# Patient Record
Sex: Female | Born: 1980 | Race: White | Hispanic: No | Marital: Married | Smoking: Former smoker
Health system: Southern US, Community
[De-identification: ages and names within clinical notes are randomized; demographics above are authoritative.]

## PROBLEM LIST (undated history)

## (undated) DIAGNOSIS — Z1371 Encounter for nonprocreative screening for genetic disease carrier status: Secondary | ICD-10-CM

## (undated) DIAGNOSIS — R87619 Unspecified abnormal cytological findings in specimens from cervix uteri: Secondary | ICD-10-CM

## (undated) HISTORY — DX: Encounter for nonprocreative screening for genetic disease carrier status: Z13.71

## (undated) HISTORY — DX: Unspecified abnormal cytological findings in specimens from cervix uteri: R87.619

---

## 2003-04-16 HISTORY — PX: COLPOSCOPY: SHX161

## 2015-09-08 ENCOUNTER — Ambulatory Visit (INDEPENDENT_AMBULATORY_CARE_PROVIDER_SITE_OTHER): Payer: Managed Care, Other (non HMO) | Admitting: Nurse Practitioner

## 2015-09-08 ENCOUNTER — Encounter: Payer: Self-pay | Admitting: Nurse Practitioner

## 2015-09-08 ENCOUNTER — Telehealth: Payer: Self-pay | Admitting: Nurse Practitioner

## 2015-09-08 VITALS — BP 112/66 | HR 60 | Ht 67.0 in | Wt 149.0 lb

## 2015-09-08 DIAGNOSIS — Z0000 Encounter for general adult medical examination without abnormal findings: Secondary | ICD-10-CM | POA: Diagnosis not present

## 2015-09-08 DIAGNOSIS — Z3009 Encounter for other general counseling and advice on contraception: Secondary | ICD-10-CM

## 2015-09-08 DIAGNOSIS — Z803 Family history of malignant neoplasm of breast: Secondary | ICD-10-CM

## 2015-09-08 DIAGNOSIS — Z23 Encounter for immunization: Secondary | ICD-10-CM

## 2015-09-08 DIAGNOSIS — Z1211 Encounter for screening for malignant neoplasm of colon: Secondary | ICD-10-CM

## 2015-09-08 DIAGNOSIS — Z01419 Encounter for gynecological examination (general) (routine) without abnormal findings: Secondary | ICD-10-CM

## 2015-09-08 LAB — POCT URINALYSIS DIPSTICK
BILIRUBIN UA: NEGATIVE
Blood, UA: NEGATIVE
GLUCOSE UA: NEGATIVE
KETONES UA: NEGATIVE
LEUKOCYTES UA: NEGATIVE
NITRITE UA: NEGATIVE
Protein, UA: NEGATIVE
Urobilinogen, UA: NEGATIVE
pH, UA: 5.5

## 2015-09-08 LAB — COMPREHENSIVE METABOLIC PANEL
ALT: 16 U/L (ref 6–29)
AST: 19 U/L (ref 10–30)
Albumin: 4.5 g/dL (ref 3.6–5.1)
Alkaline Phosphatase: 29 U/L — ABNORMAL LOW (ref 33–115)
BUN: 10 mg/dL (ref 7–25)
CALCIUM: 9.4 mg/dL (ref 8.6–10.2)
CO2: 25 mmol/L (ref 20–31)
Chloride: 102 mmol/L (ref 98–110)
Creat: 0.83 mg/dL (ref 0.50–1.10)
GLUCOSE: 84 mg/dL (ref 65–99)
POTASSIUM: 4.4 mmol/L (ref 3.5–5.3)
Sodium: 139 mmol/L (ref 135–146)
Total Bilirubin: 0.5 mg/dL (ref 0.2–1.2)
Total Protein: 7.3 g/dL (ref 6.1–8.1)

## 2015-09-08 LAB — TSH: TSH: 1.73 m[IU]/L

## 2015-09-08 LAB — LIPID PANEL
CHOL/HDL RATIO: 2.2 ratio (ref <=5.0)
Cholesterol: 146 mg/dL (ref 125–200)
HDL: 66 mg/dL (ref >=46)
LDL CALC: 64 mg/dL (ref <130)
Triglycerides: 80 mg/dL (ref <150)
VLDL: 16 mg/dL (ref <30)

## 2015-09-08 NOTE — Telephone Encounter (Signed)
Return call to patient, left message to call back. Ask for triage nurse. 

## 2015-09-08 NOTE — Telephone Encounter (Signed)
Patient returned call. Had appointment today with Shirlyn GoltzPatty Grubb, FNP and discussed Mirena IUD. Has had this in past.  Currently on OCP and desires to change to Mirena. Menses due week of 09-18-15. G2 P2.  Appointment scheduled for 09-20-15 with Dr Oscar LaJertson as patient discussed with Patty.  Advised to take Motrin 800 mg one hour prior with food.   Routing to provider for final review. Patient agreeable to disposition. Will close encounter.    CC: Dr Oscar LaJertson

## 2015-09-08 NOTE — Patient Instructions (Signed)

## 2015-09-08 NOTE — Telephone Encounter (Signed)
Patient has checked insurance benefits for iud procedure and is calling to schedule.

## 2015-09-08 NOTE — Progress Notes (Signed)
Patient ID: Holly Vasquez, female   DOB: 05/10/1980, 35 y.o.   MRN: [id number]  35 y.o. G2P2002 Married  Caucasian Fe here for NGYN annual exam.  Menses is regular.  Flow now at 3 days.  Moderate to light.  Previous Mirena IUD with amenorrhea.  Mirena was changed to OCP about 2013.  She desires to go back on Mirena IUD with next menses if at all possible.   She has a strong family history of colon cancer on her father's side of the family with her father passing at age 33.  She desires further genetic testing and screening colonoscopy.  There is also breast cancer with MGM. They have moved here with her husbands job as a Investment banker, operational[employer]f [place]  She has 2 sons who are active.  Patient's last menstrual period was 08/22/2015 (exact date).          Sexually active: Yes.    The current method of family planning is OCP (estrogen/progesterone).    Exercising: Yes.    running 5 days per week, weight lifting 2 days per week Smoker:  no  Health Maintenance: Pap:  2013, normal per patient, history of abnormal in 2005 TDaP:  2008? HIV: 2005 with pregnancy Labs: discuss today   Urine:negative   reports that she quit smoking about 12 years ago. She has never used smokeless tobacco. She reports that she drinks about 1.8 - 2.4 oz of alcohol per week. She reports that she does not use illicit drugs.  Past Medical History  Diagnosis Date  . Abnormal Pap smear of cervix 2005 & 2008    colpo biopsy    Past Surgical History  Procedure Laterality Date  . Colposcopy  2005    Current Outpatient Prescriptions  Medication Sig Dispense Refill  . levonorgestrel-ethinyl estradiol (AVIANE,ALESSE,LESSINA) 0.1-20 MG-MCG tablet Take 1 tablet by mouth daily.     No current facility-administered medications for this visit.    Family History  Problem Relation Age of Onset  . Colon cancer Father 7633    colon with mets  . Brain cancer Paternal Uncle 55    brain cancer  . Stroke Maternal Grandmother 40  .  Breast cancer Maternal Grandmother 38    lumpectomy and radiation  . Diabetes Maternal Grandfather   . Diabetes Paternal Grandmother   . Kidney Stones Paternal Grandmother   . Alzheimer's disease Paternal Grandmother   . Heart disease Paternal Grandfather   . Heart attack Paternal Grandfather     multiple  . Prostate cancer Paternal Uncle [phone]    prostate  . Colon cancer Paternal Uncle [id number]    colon    ROS:  Pertinent items are noted in HPI.  Otherwise, a comprehensive ROS was negative.  Exam:   BP 112/66 mmHg  Pulse 60  Ht 5\' 7"  (1.702 m)  Wt 149 lb (67.586 kg)  BMI 23.33 kg/m2  LMP 08/22/2015 (Exact Date) Height: 5\' 7"  (170.2 cm) Ht Readings from Last 3 Encounters:  09/08/15 5\' 7"  (1.702 m)    General appearance: alert, cooperative and appears stated age Head: Normocephalic, without obvious abnormality, atraumatic Neck: no adenopathy, supple, symmetrical, trachea midline and thyroid normal to inspection and palpation Lungs: clear to auscultation bilaterally Breasts: normal appearance, no masses or tenderness Heart: regular rate and rhythm Abdomen: soft, non-tender; no masses,  no organomegaly Extremities: extremities normal, atraumatic, no cyanosis or edema Skin: Skin color, texture, turgor normal. No rashes or lesions Lymph nodes: Cervical, supraclavicular, and axillary nodes normal.  No abnormal inguinal nodes palpated Neurologic: Grossly normal   Pelvic: External genitalia:  no lesions              Urethra:  normal appearing urethra with no masses, tenderness or lesions              Bartholin's and Skene's: normal                 Vagina: normal appearing vagina with normal color and discharge, no lesions              Cervix: anteverted              Pap taken: Yes.   Bimanual Exam:  Uterus:  normal size, contour, position, consistency, mobility, non-tender              Adnexa: no mass, fullness, tenderness               Rectovaginal: Confirms               Anus:   normal sphincter tone, no lesions  Chaperone present: yes  A:  Well Woman with normal exam  Currently OCP and desires to go back on Mirena IUD  Strong [facility] of colon cancer, father diagnosed at age 64 and passed age 24  Desires genetic testing  Update immunization  P:   Reviewed health and wellness pertinent to exam  Pap smear as above  Mammogram will get baseline screening  Referral to genetic counseling  Given information about Mirena with insurance codes and she will verify coverage.  Will send a note to Dr. Oscar La and hopefully get this done week of June 5th  Update of TDaP is given today  Will follow with labs  Referral to Dr. Loreta Ave counseled on breast self exam, mammography screening, family planning choices, adequate intake of calcium and vitamin D, diet and exercise return annually or prn  An After Visit Summary was printed and given to the patient.

## 2015-09-09 LAB — VITAMIN D 25 HYDROXY (VIT D DEFICIENCY, FRACTURES): VIT D 25 HYDROXY: 54 ng/mL (ref 30–100)

## 2015-09-11 NOTE — Progress Notes (Signed)
Encounter reviewed by Dr. Natayla Cadenhead Amundson C. Silva.  

## 2015-09-12 ENCOUNTER — Other Ambulatory Visit: Payer: Self-pay | Admitting: Nurse Practitioner

## 2015-09-12 DIAGNOSIS — Z1231 Encounter for screening mammogram for malignant neoplasm of breast: Secondary | ICD-10-CM

## 2015-09-12 NOTE — Addendum Note (Signed)
Addended by: Ria CommentGRUBB, Roxy Filler R on: 09/12/2015 11:12 AM   Modules accepted: Orders

## 2015-09-14 HISTORY — PX: INTRAUTERINE DEVICE (IUD) INSERTION: SHX5877

## 2015-09-14 LAB — IPS PAP TEST WITH HPV

## 2015-09-18 ENCOUNTER — Telehealth: Payer: Self-pay | Admitting: Genetic Counselor

## 2015-09-18 ENCOUNTER — Encounter: Payer: Self-pay | Admitting: Genetic Counselor

## 2015-09-18 NOTE — Telephone Encounter (Signed)
Verified address and insurance, in basket referring date/time, and mailed new pt packet

## 2015-09-19 ENCOUNTER — Telehealth: Payer: Self-pay | Admitting: Obstetrics and Gynecology

## 2015-09-19 NOTE — Telephone Encounter (Signed)
Patient has iud insertion appointment tomorrow and has not started her cycle. Patient is asking if she will need to reschedule if she does not start her cyce by tomorrow morning?

## 2015-09-19 NOTE — Telephone Encounter (Signed)
Spoke with patient. Patient states that she started her placebo pills of her Alesse OCP pack on Sunday 09/17/2015. On 09/17/2015 she began to have light bleeding which stopped yesterday. Asking if she may keep her appointment as scheduled for tomorrow for IUD insertion. Advised it is okay to keep her appointment as scheduled for tomorrow 09/20/2015 at 8:30 am for IUD insertion. She is agreeable and verbalizes understanding.  Routing to provider for final review. Patient agreeable to disposition. Will close encounter.

## 2015-09-20 ENCOUNTER — Ambulatory Visit (INDEPENDENT_AMBULATORY_CARE_PROVIDER_SITE_OTHER): Payer: Managed Care, Other (non HMO) | Admitting: Obstetrics and Gynecology

## 2015-09-20 ENCOUNTER — Encounter: Payer: Self-pay | Admitting: Obstetrics and Gynecology

## 2015-09-20 VITALS — BP 108/70 | HR 52 | Resp 12 | Wt 147.0 lb

## 2015-09-20 DIAGNOSIS — Z01812 Encounter for preprocedural laboratory examination: Secondary | ICD-10-CM | POA: Diagnosis not present

## 2015-09-20 DIAGNOSIS — Z3009 Encounter for other general counseling and advice on contraception: Secondary | ICD-10-CM | POA: Diagnosis not present

## 2015-09-20 DIAGNOSIS — Z30430 Encounter for insertion of intrauterine contraceptive device: Secondary | ICD-10-CM

## 2015-09-20 DIAGNOSIS — Z113 Encounter for screening for infections with a predominantly sexual mode of transmission: Secondary | ICD-10-CM

## 2015-09-20 LAB — POCT URINE PREGNANCY: PREG TEST UR: NEGATIVE

## 2015-09-20 NOTE — Patient Instructions (Signed)
IUD Post-procedure Instructions . Cramping is common.  You may take Ibuprofen, Aleve, or Tylenol for the cramping.  This should resolve within 24 hours.   . You may have a small amount of spotting.  You should wear a mini pad for the next few days. . You may have intercourse in 24 hours. . You need to call the office if you have any pelvic pain, fever, heavy bleeding, or foul smelling vaginal discharge. . Shower or bathe as normal Use condoms for 1 week

## 2015-09-20 NOTE — Addendum Note (Signed)
Addended by: Michell Heinrich'NEAL, Kodie Kishi D on: 09/20/2015 09:50 AM   Modules accepted: Orders

## 2015-09-20 NOTE — Progress Notes (Signed)
GYNECOLOGY  VISIT   HPI: 35 y.o.   Married  Caucasian  female   G2P2002 with Patient's last menstrual period was 09/19/2015 (approximate).   here for IUD Insertion. She has had a mirena IUD in the past and did well with it.   GYNECOLOGIC HISTORY: Patient's last menstrual period was 09/19/2015 (approximate). Contraception:IUD Menopausal hormone therapy: none        OB History    Gravida Para Term Preterm AB TAB SAB Ectopic Multiple Living   2 2 2  0 0 0 0 0 0 2         There are no active problems to display for this patient.   Past Medical History  Diagnosis Date  . Abnormal Pap smear of cervix 2005 & 2008    colpo biopsy    Past Surgical History  Procedure Laterality Date  . Colposcopy  2005    Current Outpatient Prescriptions  Medication Sig Dispense Refill  . levonorgestrel (MIRENA) 20 MCG/24HR IUD 1 each by Intrauterine route once.     No current facility-administered medications for this visit.     ALLERGIES: Review of patient's allergies indicates no known allergies.  Family History  Problem Relation Age of Onset  . Colon cancer Father 3233    colon with mets  . Brain cancer Paternal Uncle 55    brain cancer  . Stroke Maternal Grandmother 40  . Breast cancer Maternal Grandmother 38    lumpectomy and radiation  . Diabetes Maternal Grandfather   . Diabetes Paternal Grandmother   . Kidney Stones Paternal Grandmother   . Alzheimer's disease Paternal Grandmother   . Heart disease Paternal Grandfather   . Heart attack Paternal Grandfather     multiple  . Prostate cancer Paternal Uncle [phone]    prostate  . Colon cancer Paternal Uncle 1056    colon    Social History   Social History  . Marital Status: Married    Spouse Name: N/A  . Number of Children: N/A  . Years of Education: N/A   Occupational History  . Not on file.   Social History Main Topics  . Smoking status: Former Smoker    Quit date: 04/16/2003  . Smokeless tobacco: Never Used  . Alcohol  Use: 1.8 - 2.4 oz/week    3-4 Standard drinks or equivalent per week  . Drug Use: No  . Sexual Activity:    Partners: Male    Birth Control/ Protection: Pill   Other Topics Concern  . Not on file   Social History Narrative    ROS  PHYSICAL EXAMINATION:    BP 108/70 mmHg  Pulse 52  Resp 12  Wt 147 lb (66.679 kg)  LMP 09/19/2015 (Approximate)    General appearance: alert, cooperative and appears stated age  Pelvic: External genitalia:  no lesions              Urethra:  normal appearing urethra with no masses, tenderness or lesions              Bartholins and Skenes: normal                 Vagina: normal appearing vagina with normal color and discharge, no lesions              Cervix: no lesions              Bimanual Exam:  Uterus:  normal size, contour, position, consistency, mobility, non-tender  Adnexa: no mass, fullness, tenderness                The risks of the mirena IUD were reviewed with the patient, including infection, abnormal bleeding and uterine perfortion. Consent was signed.  A speculum was placed in the vagina, the cervix was cleansed with betadine. A tenaculum was placed on the cervix, the uterus sounded to 7 cm. The mirena IUD was inserted without difficulty. The string were cut to 3-4 cm. The tenaculum was removed. Slight oozing from the tenaculum site was stopped with pressure.   The patient tolerated the procedure well.   Chaperone was present for exam.  ASSESSMENT Mirena IUD insertion    PLAN Screening for GC/CT F/U in 1 month, call with any concerns   An After Visit Summary was printed and given to the patient.

## 2015-09-21 LAB — GC/CHLAMYDIA PROBE AMP
CT Probe RNA: NOT DETECTED
GC PROBE AMP APTIMA: NOT DETECTED

## 2015-09-22 ENCOUNTER — Ambulatory Visit: Payer: Self-pay

## 2015-10-18 ENCOUNTER — Encounter: Payer: Self-pay | Admitting: Obstetrics and Gynecology

## 2015-10-18 ENCOUNTER — Ambulatory Visit (INDEPENDENT_AMBULATORY_CARE_PROVIDER_SITE_OTHER): Payer: Managed Care, Other (non HMO) | Admitting: Obstetrics and Gynecology

## 2015-10-18 VITALS — BP 100/60 | HR 64 | Resp 14 | Wt 144.0 lb

## 2015-10-18 DIAGNOSIS — Z30431 Encounter for routine checking of intrauterine contraceptive device: Secondary | ICD-10-CM

## 2015-10-18 NOTE — Progress Notes (Signed)
Patient ID: Holly Vasquez, female   DOB: 10/23/1980, 35 y.o.   MRN: [id number] GYNECOLOGY  VISIT   HPI: 35 y.o.   Married  Caucasian  female   G2P2002 with Patient's last menstrual period was 10/17/2015.   here for  Follow up IUD. She had a mirena IUD placed last month. Last week she had some increased cramping, now she feels fine. Currently without c/o. She just started spotting yesterday.   GYNECOLOGIC HISTORY: Patient's last menstrual period was 10/17/2015. Contraception:IUD Menopausal hormone therapy: none         OB History    Gravida Para Term Preterm AB TAB SAB Ectopic Multiple Living   2 2 2  0 0 0 0 0 0 2         There are no active problems to display for this patient.   Past Medical History  Diagnosis Date  . Abnormal Pap smear of cervix 2005 & 2008    colpo biopsy    Past Surgical History  Procedure Laterality Date  . Colposcopy  2005  . Intrauterine device (iud) insertion  09/2015    Current Outpatient Prescriptions  Medication Sig Dispense Refill  . levonorgestrel (MIRENA) 20 MCG/24HR IUD 1 each by Intrauterine route once.     No current facility-administered medications for this visit.     ALLERGIES: Review of patient's allergies indicates no known allergies.  Family History  Problem Relation Age of Onset  . Colon cancer Father 3633    colon with mets  . Brain cancer Paternal Uncle 55    brain cancer  . Stroke Maternal Grandmother 40  . Breast cancer Maternal Grandmother 38    lumpectomy and radiation  . Diabetes Maternal Grandfather   . Diabetes Paternal Grandmother   . Kidney Stones Paternal Grandmother   . Alzheimer's disease Paternal Grandmother   . Heart disease Paternal Grandfather   . Heart attack Paternal Grandfather     multiple  . Prostate cancer Paternal Uncle 7355    prostate  . Colon cancer Paternal Uncle 6956    colon    Social History   Social History  . Marital Status: Married    Spouse Name: N/A  . Number of Children: N/A  .  Years of Education: N/A   Occupational History  . Not on file.   Social History Main Topics  . Smoking status: Former Smoker    Quit date: 04/16/2003  . Smokeless tobacco: Never Used  . Alcohol Use: 1.8 - 2.4 oz/week    3-4 Standard drinks or equivalent per week  . Drug Use: No  . Sexual Activity:    Partners: Male    Birth Control/ Protection: Pill   Other Topics Concern  . Not on file   Social History Narrative   6/17: Husband is the head chef of [employer]. 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 Boys are 8 and 12. [place]. Adjusting well.     Review of Systems  Constitutional: Negative.   HENT: Negative.   Eyes: Negative.   Respiratory: Negative.   Cardiovascular: Negative.   Genitourinary: Negative.   Musculoskeletal: Negative.   Skin: Negative.   Neurological: Negative.   Endo/Heme/Allergies: Negative.   Psychiatric/Behavioral: Negative.     PHYSICAL EXAMINATION:    BP 100/60 mmHg  Pulse 64  Resp 14  Wt 144 lb (65.318 kg)  LMP 10/17/2015    General appearance: alert, cooperative and appears stated age Abdomen: soft,  non-tender; bowel sounds normal; no masses,  no organomegaly  Pelvic: External genitalia:  no lesions              Urethra:  normal appearing urethra with no masses, tenderness or lesions              Bartholins and Skenes: normal                 Vagina: normal appearing vagina with normal color and discharge, no lesions              Cervix: no lesions and IUD string 2 cm              Bimanual Exam:  Uterus:  normal size, contour, position, consistency, mobility, non-tender and anteverted              Adnexa: no mass, fullness, tenderness               Chaperone was present for exam.  ASSESSMENT IUD check, she had some increased cramping last week, now feels fine.    PLAN If she has recurrent cramping, she will f/u for an ultrasound F/U with Mrs. Berneice GandyGrubb for an annual exam next May   An  After Visit Summary was printed and given to the patient.

## 2015-11-01 ENCOUNTER — Other Ambulatory Visit: Payer: Self-pay

## 2015-11-01 ENCOUNTER — Encounter: Payer: Self-pay | Admitting: Genetic Counselor

## 2015-11-27 ENCOUNTER — Encounter: Payer: Self-pay | Admitting: Genetic Counselor

## 2015-11-28 ENCOUNTER — Other Ambulatory Visit: Payer: Managed Care, Other (non HMO)

## 2015-11-28 ENCOUNTER — Ambulatory Visit (HOSPITAL_BASED_OUTPATIENT_CLINIC_OR_DEPARTMENT_OTHER): Payer: Managed Care, Other (non HMO) | Admitting: Genetic Counselor

## 2015-11-28 DIAGNOSIS — Z808 Family history of malignant neoplasm of other organs or systems: Secondary | ICD-10-CM | POA: Diagnosis not present

## 2015-11-28 DIAGNOSIS — Z803 Family history of malignant neoplasm of breast: Secondary | ICD-10-CM | POA: Diagnosis not present

## 2015-11-28 DIAGNOSIS — Z809 Family history of malignant neoplasm, unspecified: Secondary | ICD-10-CM | POA: Diagnosis not present

## 2015-11-28 DIAGNOSIS — Z801 Family history of malignant neoplasm of trachea, bronchus and lung: Secondary | ICD-10-CM

## 2015-11-28 DIAGNOSIS — Z800 Family history of malignant neoplasm of digestive organs: Secondary | ICD-10-CM | POA: Diagnosis not present

## 2015-11-28 DIAGNOSIS — Z8042 Family history of malignant neoplasm of prostate: Secondary | ICD-10-CM

## 2015-11-29 ENCOUNTER — Encounter: Payer: Self-pay | Admitting: Genetic Counselor

## 2015-11-29 DIAGNOSIS — Z803 Family history of malignant neoplasm of breast: Secondary | ICD-10-CM | POA: Insufficient documentation

## 2015-11-29 DIAGNOSIS — Z800 Family history of malignant neoplasm of digestive organs: Secondary | ICD-10-CM | POA: Insufficient documentation

## 2015-11-29 NOTE — Progress Notes (Signed)
REFERRING PROVIDER: Kem Boroughs, FNP  PRIMARY PROVIDER:  No PCP Per Patient  PRIMARY REASON FOR VISIT:  1. Family history of colon cancer   2. Family history of breast cancer in female   3. Family history of prostate cancer   4. Family history of brain cancer   5. Family history of lung cancer   6. Family history of cancer      HISTORY OF PRESENT ILLNESS:   Holly Vasquez, a 35 y.o. female, was seen for a [place] cancer genetics consultation at the request of Edman Circle, FNP due to a family history of early-onset colon and breast cancers.  Holly Vasquez presents to clinic today to discuss the possibility of a hereditary predisposition to cancer, genetic testing, and to further clarify her future cancer risks, as well as potential cancer risks for family members.   Holly Vasquez is a 35 y.o. female with no personal history of cancer.     HORMONAL RISK FACTORS:  Menarche was at age 60.  First live birth at age 84.  OCP use -  pills for 5 years; Mirena IUD for 5 years; Nuvaring for 1 year.  Ovaries intact: yes.  Hysterectomy: no.  Menopausal status: premenopausal.  HRT use: 0 years. Colonoscopy: Schedule for first colonoscopy next week (w/ Dr. Collene Mares) Mammogram within the last year: no - scheduled for first mammogram in September 2017 (next month). Number of breast biopsies: 0. Up to date with pelvic exams:  yes. Any excessive radiation exposure/other exposures in the past:  no  Past Medical History:  Diagnosis Date  . Abnormal Pap smear of cervix 2005 & 2008   colpo biopsy    Past Surgical History:  Procedure Laterality Date  . COLPOSCOPY  2005  . INTRAUTERINE DEVICE (IUD) INSERTION  09/2015    Social History   Social History  . Marital status: Married    Spouse name: N/A  . Number of children: N/A  . Years of education: N/A   Social History Main Topics  . Smoking status: Former Smoker    Packs/day: 1.00    Years: 3.00    Quit date: 04/16/2003  . Smokeless  tobacco: Never Used     Comment: 1 ppd for 3-4 yrs  . Alcohol use 1.8 - 2.4 oz/week    3 - 4 Standard drinks or equivalent per week  . Drug use: No  . Sexual activity: Yes    Partners: Male    Birth control/ protection: Pill   Other Topics Concern  . None   Social History Narrative   6/17: Husband is the head chef of [employer]. She is working in a Charity fundraiser. Boys are 8 and 12. They just moved here from [facility] in 3/16. Adjusting well.      FAMILY HISTORY:  We obtained a detailed, 4-generation family history.  Significant diagnoses are listed below: Family History  Problem Relation Age of Onset  . Colon polyps Mother     approx 2 polyps  . Colon cancer Father 34    colon with mets to lungs; colon surgery w/ recurrence; +smoker and heavy alcohol use  . Stroke Maternal Grandmother 40  . Breast cancer Maternal Grandmother 40    lumpectomy and radiation  . Diabetes Maternal Grandfather   . Diabetes Paternal Grandmother   . Kidney Stones Paternal Grandmother   . Alzheimer's disease Paternal Grandmother   . Heart disease Paternal Grandfather   . Heart attack Paternal Grandfather  multiple  . Brain cancer Paternal Uncle 64  . Lung cancer Paternal Uncle 9  . Prostate cancer Paternal Uncle 32    prostate  . Colon cancer Paternal Uncle     dx. mid-50s; something else removed from colon at age 53y  . Colon cancer Paternal Uncle 61    colon; "Lynch syndrome"  . Cancer Other     maternal great grandfather (MGM's father); dx. older age; NOS cancer    Holly Vasquez has two sons, ages 71 and 100.  She has one full sister who is currently 1 and one full brother who is 8.  Neither of her siblings have been diagnosed with cancer.  Holly Vasquez mother is currently 55, and has never been diagnosed with cancer.  She has a history of a couple of colon polyps.  Holly Vasquez father died of metastatic cancer at the age of 52.  He was first diagnosed with colon cancer at  the age of 21.  This was treated with surgery and later recurred and then metastasized to his lungs.  Holly Vasquez reports a history of smoking and alcohol use for her father.    Holly Vasquez mother has three full brothers, ages 91-58.  None of these brothers have been diagnosed with cancer. Holly Vasquez reports no history of cancer for her maternal first cousins.  Holly Vasquez maternal grandmother is currently 57.  She has a history of breast cancer, diagnosed at the age of 82 and treated with lumpectomy.  Her grandmother's father was diagnosed with an unspecified type of cancer at an older age.  Holly Vasquez maternal grandfather is currently 19 and has not had cancer.  Holly Vasquez has no further information for her maternal great aunts/uncles and great grandparents.    Holly Vasquez father had three full brothers.  One brother died of cancer at the age of 58--he was diagnosed with brain and lung cancer around the same time and passed away shortly after.  This uncle has one son and one daughter, neither of whom have had cancer.  Another uncle is currently 65 and has recently been diagnosed with colon cancer.  Holly Vasquez also reports that this uncle has been diagnosed with "Lynch syndrome", but she is not sure whether he has had genetic testing or whether this a clinical diagnosis.  The third uncle is currently 19 and has a history of colon cancer, diagnosed in his mid-50s, and a history of prostate cancer, diagnosed at age 79-60.  Holly Vasquez has limited information for some of her other paternal first cousins.  Holly Vasquez paternal grandmother died of alzheimer's-related causes around the age of 14.  Holly Vasquez paternal grandfather died of a heart attack at 15.  Holly Vasquez has no further information for any paternal great aunts/uncles or great grandparents.    Ms. Maret is unaware of any previous family history of genetic testing for hereditary cancer.  Patient's maternal ancestors are of [place] descent, and  paternal ancestors are of [place] descent. There is no reported Ashkenazi Jewish ancestry. There is no known consanguinity.  GENETIC COUNSELING ASSESSMENT: Lallie Strahm is a 35 y.o. female with a family history of early-onset breast and colon cancers which is somewhat suggestive of a hereditary cancer syndrome and predisposition to cancer. We, therefore, discussed and recommended the following at today's visit.   DISCUSSION: We reviewed the characteristics, features and inheritance patterns of hereditary cancer syndromes, particularly those caused by mutations within the Lynch syndrome and BRCA1/2 genes. We also  discussed genetic testing, including the appropriate family members to test, the process of testing, insurance coverage and turn-around-time for results. We discussed the implications of a negative, positive and/or variant of uncertain significant result. We recommended Ms. Isais pursue genetic testing for the 32-gene Comprehensive Cancer Panel with MSH2 Exons 1-7 Inversion Analysis.  The Comprehensive Cancer Panel offered by [facility] includes sequencing and/or deletion duplication testing of the following 32 genes: APC, ATM, AXIN2, BARD1, BMPR1A, BRCA1, BRCA2, BRIP1, CDH1, CDK4, CDKN2A, CHEK2, EPCAM, FANCC, MLH1, MSH2, MSH6, MUTYH, NBN, PALB2, PMS2, POLD1, POLE, PTEN, RAD51C, RAD51D, SCG5/GREM1, SMAD4, STK11, TP53, VHL, and XRCC2.    Based on Ms. Klas's family history of cancer, she meets medical criteria for genetic testing. Despite that she meets criteria, she may still have an out of pocket cost. We discussed that if her out of pocket cost for testing is over $100, the laboratory will call and confirm whether she wants to proceed with testing.  If the out of pocket cost of testing is less than $100 she will be billed by the genetic testing laboratory.   Based on the patient's personal and family history, a statistical model (IBIS/Tyrer-Cuzick)  and literature data were used to estimate her risk  of developing breast cancer. This estimates her lifetime risk of developing breast to be approximately 19.1%. This estimation does not take into account any genetic testing results.  The patient's lifetime breast cancer risk is a preliminary estimate based on available information using one of several models endorsed by the [place] (ACS). The ACS recommends consideration of breast MRI screening as an adjunct to mammography for patients at high risk (defined as 20% or greater lifetime risk). A more detailed breast cancer risk assessment can be considered, if clinically indicated.   PLAN: After considering the risks, benefits, and limitations, Ms. Runquist  provided informed consent to pursue genetic testing and the blood sample was sent to [facility] for analysis of the 32-gene Comprehensive Cancer Panel with MSH2 Exons 1-7 Inversion Analysis. Results should be available within approximately 2-3 weeks' time, at which point they will be disclosed by telephone to Ms. Crespo, as will any additional recommendations warranted by these results. Ms. Soltau will receive a summary of her genetic counseling visit and a copy of her results once available. This information will also be available in Epic. We encouraged Ms. Kiernan to remain in contact with cancer genetics annually so that we can continuously update the family history and inform her of any changes in cancer genetics and testing that may be of benefit for her family. Ms. Goga questions were answered to her satisfaction today. Our contact information was provided should additional questions or concerns arise.  Thank you for the referral and allowing [place] to share in the care of your patient.   Jeanine Luz, MS, [facility] Certified Genetic Counselor [email or web address] Phone: [phone]  The patient was seen for a total of 45 minutes in face-to-face genetic counseling.  This patient was discussed with Drs. Magrinat, Lindi Adie and/or  Burr Medico who agrees with the above.    _______________________________________________________________________ For Office Staff:  Number of people involved in session: 1 Was an Intern/ student involved with case: no

## 2015-12-12 ENCOUNTER — Ambulatory Visit
Admission: RE | Admit: 2015-12-12 | Discharge: 2015-12-12 | Disposition: A | Discharge status: Home / Self Care | Payer: Managed Care, Other (non HMO) | Source: Ambulatory Visit | Attending: Nurse Practitioner | Admitting: Nurse Practitioner

## 2015-12-12 DIAGNOSIS — Z1231 Encounter for screening mammogram for malignant neoplasm of breast: Secondary | ICD-10-CM

## 2015-12-14 ENCOUNTER — Other Ambulatory Visit: Payer: Self-pay | Admitting: Nurse Practitioner

## 2015-12-14 DIAGNOSIS — R928 Other abnormal and inconclusive findings on diagnostic imaging of breast: Secondary | ICD-10-CM

## 2015-12-15 DIAGNOSIS — Z1371 Encounter for nonprocreative screening for genetic disease carrier status: Secondary | ICD-10-CM

## 2015-12-15 HISTORY — DX: Encounter for nonprocreative screening for genetic disease carrier status: Z13.71

## 2015-12-20 ENCOUNTER — Telehealth: Payer: Self-pay | Admitting: Genetic Counselor

## 2015-12-20 NOTE — Telephone Encounter (Signed)
Discussed with Ms. Holly Vasquez that her genetic test result was negative for known pathogenic mutations within any of 32 genes on the Comprehensive Cancer Panel.  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  Discussed that we treat this just like a negative test result and reviewed why we do that.  Encouraged her brother and sister to have genetic testing, as there could be a mutation in the family that Ms. Holly Vasquez herself just did not inherit.  She is scheduled for a colonoscopy and mammogram soon.  Recommended colonoscopies for her siblings.  Her sister can get a mammogram at 1930.  Ms. Holly Vasquez's maternal grandmother would be a good candidate for genetic testing due to her breast cancer at 7240, but other maternal relatives could have genetic testing as well.  Recommended genetic testing for Ms. Holly Vasquez's paternal uncles with colon cancer.  Ms. Holly Vasquez's [place] in [place], so we discussed genetic counseling/testing for them and that they could use the [email or web address] website to find a Dentistgenetic counselor near them.  Emailed Ms. Holly Vasquez a copy of her results.  She is welcome to call or email with any questions.

## 2015-12-21 ENCOUNTER — Ambulatory Visit
Admission: RE | Admit: 2015-12-21 | Discharge: 2015-12-21 | Disposition: A | Discharge status: Home / Self Care | Payer: Managed Care, Other (non HMO) | Source: Ambulatory Visit | Attending: Nurse Practitioner | Admitting: Nurse Practitioner

## 2015-12-21 ENCOUNTER — Ambulatory Visit: Payer: Self-pay | Admitting: Genetic Counselor

## 2015-12-21 DIAGNOSIS — Z1379 Encounter for other screening for genetic and chromosomal anomalies: Secondary | ICD-10-CM

## 2015-12-21 DIAGNOSIS — R928 Other abnormal and inconclusive findings on diagnostic imaging of breast: Secondary | ICD-10-CM

## 2015-12-21 DIAGNOSIS — Z809 Family history of malignant neoplasm, unspecified: Secondary | ICD-10-CM

## 2015-12-21 DIAGNOSIS — Z800 Family history of malignant neoplasm of digestive organs: Secondary | ICD-10-CM

## 2015-12-21 DIAGNOSIS — Z803 Family history of malignant neoplasm of breast: Secondary | ICD-10-CM

## 2015-12-24 DIAGNOSIS — Z1379 Encounter for other screening for genetic and chromosomal anomalies: Secondary | ICD-10-CM | POA: Insufficient documentation

## 2015-12-24 NOTE — Progress Notes (Signed)
GENETIC TEST RESULT  HPI: Holly Vasquez was previously seen in the [facility] due to a family history of colon, breast, and other cancers and concerns regarding a hereditary predisposition to cancer. Please refer to our prior cancer genetics clinic note from November 28, 2015 for more information regarding Holly Vasquez's medical, social and family histories, and our assessment and recommendations, at the time. Holly Vasquez recent genetic test results were disclosed to her, as were recommendations warranted by these results. These results and recommendations are discussed in more detail below.  GENETIC TEST RESULTS: At the time of Holly Vasquez's visit on 11/28/15, we recommended she pursue genetic testing of the 32-gene Comprehensive Cancer Panel with MSH2 Exons 1-7 Inversion Analysis through [facility] Holly Pigeon, MD).  The Comprehensive Cancer Panel offered by [facility] includes sequencing and/or deletion duplication testing of the following 32 genes: APC, ATM, AXIN2, BARD1, BMPR1A, BRCA1, BRCA2, BRIP1, CDH1, CDK4, CDKN2A, CHEK2, EPCAM, FANCC, MLH1, MSH2, MSH6, MUTYH, NBN, PALB2, PMS2, POLD1, POLE, PTEN, RAD51C, RAD51D, SCG5/GREM1, SMAD4, STK11, TP53, VHL, and XRCC2.  Those results are now back, the report date for which is December 14, 2015.  Genetic testing was normal, and did not reveal a known deleterious mutation in these genes.  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  The test report will be scanned into EPIC and will be located under the Results Review tab in the Pathology>Molecular Pathology section.   We discussed with Holly Vasquez that since the current genetic testing is not perfect, it is possible there may be a gene mutation in one of these genes that current testing cannot detect, but that chance is small. We also discussed, that it is possible that another gene that has not yet been discovered, or that we have not yet tested, is  responsible for the cancer diagnoses in the family, and it is, therefore, important to remain in touch with cancer genetics in the future so that we can continue to offer Holly Vasquez the most up-to-date genetic testing.    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 At this time, it is unknown if this VUS is associated with an increased risk for cancer or if this is a normal finding. Since this VUS result is uncertain, it cannot help guide screening recommendations, and family members should not be tested for this VUS to help define their own cancer risks.  Also, we all have variants within our genes that make Holly Vasquez unique individuals--most of these variants are benign.  Thus, we treat this VUS as a negative result.   With time, we suspect the lab will reclassify this variant and when they do, we will try to re-contact Holly Vasquez to discuss the reclassification further.  We also encouraged Holly Vasquez to contact Holly Vasquez in a year or two to obtain an update on the status of this VUS.  CANCER SCREENING RECOMMENDATIONS: This normal result is reassuring and indicates that Holly Vasquez does not likely have an increased risk of cancer due to a mutation in one of these genes.  We, therefore, recommended  Holly Vasquez continue to follow the cancer screening guidelines provided by her primary healthcare providers.  This includes starting her colonoscopy screening at an earlier age, due to her father's history of colon cancer at 67.  She can also begin annual mammogram screening now, due to her grandmother's history of breast cancer at  47.  Holly Vasquez has just recently had a colonoscopy and was also planning to get a mammogram soon.  We still do not have an explanation for the family history of cancer, so further testing of affected relatives would be helpful for further elucidation of the familial and personal cancer risks.  RECOMMENDATIONS  FOR FAMILY MEMBERS: Women in this family might be at some increased risk of developing breast cancer, over the general population risk, simply due to the family history of breast cancer. We recommended women in the close family have a yearly mammogram beginning at age 30, an annual clinical breast exam, and perform monthly breast self-exams. Women in this family should also have a gynecological exam as recommended by their primary provider. Paternal family members can likely begin colonoscopy screening at an earlier age, due to the family history of colon cancer.  Holly Vasquez siblings should both get a colonoscopy now, if they have not yet had one.    Based on Holly Vasquez's family history, we recommended her siblings have genetic counseling and testing, as there is a chance that there could be a mutation in the family that Holly Vasquez herself just did not inherit.  We also recommended that her maternal grandmother or her mother and other maternal relatives have genetic testing due to the early-onset breast cancer history for her maternal grandmother.  Her two paternal uncles who have a history of colon cancer are also eligible for genetic counseling and testing. Holly Vasquez will let Holly Vasquez know if we can be of any assistance in coordinating genetic counseling and/or testing for these family members.  Family members who live in other cities can use the website of the [employer] to locate a cancer Dietitian by zip code.  FOLLOW-UP: Lastly, we discussed with Holly Vasquez that cancer genetics is a rapidly advancing field and it is possible that new genetic tests will be appropriate for her and/or her family members in the future. We encouraged her to remain in contact with cancer genetics on an annual basis so we can update her personal and family histories and let her know of advances in cancer genetics that may benefit this family.   Our contact number was provided. Holly Vasquez  questions were answered to her satisfaction, and she knows she is welcome to call Holly Vasquez at anytime with additional questions or concerns.   Jeanine Luz, MS, [facility] Certified Genetic Counselor [email or web address] Phone: [phone]

## 2015-12-25 ENCOUNTER — Encounter: Payer: Self-pay | Admitting: Nurse Practitioner

## 2016-06-20 ENCOUNTER — Encounter (HOSPITAL_COMMUNITY): Payer: Self-pay

## 2016-07-22 ENCOUNTER — Telehealth: Payer: Self-pay | Admitting: *Deleted

## 2016-07-22 NOTE — Telephone Encounter (Signed)
Patient in 04 recall for 06/2016 for right breast U/S. Please contact patient regarding scheduling  Thanks

## 2016-07-24 NOTE — Telephone Encounter (Signed)
Spoke with patient. She just got back from spring break and this appointment is on her list of "To do's". Patient stated she will call and let us know when the appointment is made.

## 2016-08-02 NOTE — Telephone Encounter (Signed)
Follow up with patient to see if she has scheduled follow up breast ultrasound.  She states she has not done so yet, but she is aware she needs to call us when she schedules. States she will schedule soon.

## 2016-08-20 NOTE — Telephone Encounter (Signed)
OK to send letter and remove from recall.  Pt clearly aware of recommended follow up.

## 2016-08-20 NOTE — Telephone Encounter (Signed)
Patient has not scheduled her follow up breast imaging. Please advise on recall status.  Thanks

## 2016-08-21 ENCOUNTER — Encounter: Payer: Self-pay | Admitting: *Deleted

## 2016-08-21 NOTE — Telephone Encounter (Signed)
Letter sent - removed from recall -eh 

## 2016-09-27 NOTE — Progress Notes (Addendum)
Amendment: The date of this amended report is September 20, 2016.  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

## 2016-11-21 ENCOUNTER — Ambulatory Visit (INDEPENDENT_AMBULATORY_CARE_PROVIDER_SITE_OTHER): Payer: Managed Care, Other (non HMO) | Admitting: Obstetrics & Gynecology

## 2016-11-21 ENCOUNTER — Encounter: Payer: Self-pay | Admitting: Obstetrics & Gynecology

## 2016-11-21 ENCOUNTER — Telehealth: Payer: Self-pay | Admitting: Obstetrics and Gynecology

## 2016-11-21 ENCOUNTER — Ambulatory Visit (INDEPENDENT_AMBULATORY_CARE_PROVIDER_SITE_OTHER): Payer: Managed Care, Other (non HMO)

## 2016-11-21 VITALS — BP 110/60 | HR 78 | Resp 14 | Ht 67.0 in | Wt 155.0 lb

## 2016-11-21 DIAGNOSIS — N3090 Cystitis, unspecified without hematuria: Secondary | ICD-10-CM

## 2016-11-21 DIAGNOSIS — R310 Gross hematuria: Secondary | ICD-10-CM

## 2016-11-21 DIAGNOSIS — N3289 Other specified disorders of bladder: Secondary | ICD-10-CM | POA: Diagnosis not present

## 2016-11-21 DIAGNOSIS — R102 Pelvic and perineal pain: Secondary | ICD-10-CM

## 2016-11-21 DIAGNOSIS — N83201 Unspecified ovarian cyst, right side: Secondary | ICD-10-CM | POA: Diagnosis not present

## 2016-11-21 LAB — POCT URINALYSIS DIPSTICK
Bilirubin, UA: NEGATIVE
Glucose, UA: NEGATIVE
Ketones, UA: NEGATIVE
Leukocytes, UA: NEGATIVE
NITRITE UA: NEGATIVE
PROTEIN UA: NEGATIVE
UROBILINOGEN UA: 0.2 U/dL
pH, UA: 5 (ref 5.0–8.0)

## 2016-11-21 MED ORDER — NITROFURANTOIN MONOHYD MACRO 100 MG PO CAPS: 100.0000 mg | ORAL_CAPSULE | Freq: Two times a day (BID) | ORAL | 0 refills | Status: DC

## 2016-11-21 NOTE — Telephone Encounter (Signed)
Spoke with patient. Patient has a Mirena IUD in place. Reports she developed intermittent sharp vaginal and pelvic pain yesterday. Is having cramping with urination. Denies vaginal bleeding, vaginal discharge, nausea, vomiting, fever, chills, urinary frequency and urgency. Woke up today and pain is constant 7/10 on pain scale. Has not taken any medication for pain. Advised she will need to be seen for further evaluation. Offered morning appointment today but patient declines due to work schedule. Recommended early appointment in case further evaluation is needed after exam with MD. States she cannot be seen until after 3:30 pm. Appointment scheduled for 3:45 pm today with Dr.Miller. Patient is agreeable to date and time. Advised if symptoms worsen or develops new symptoms will need to be seen with local ER or in our office earlier. Patient is agreeable.  Routing to covering provider for final review. Patient agreeable to disposition. Will close encounter.

## 2016-11-21 NOTE — Telephone Encounter (Signed)
Patient states she is not sure if there is something wrong with her IUD or she may have an infection.

## 2016-11-21 NOTE — Progress Notes (Signed)
GYNECOLOGY  VISIT   HPI:  36 y.o. G1P2002 Married Caucasian female here for complaint of pelvic pain that has been present and coming on gradually for the past seven days.  This worsened over the past twenty four hours.  Pain is low in her abdomen and a little more on the right side.  Feels like she is having some bladder spasms but denies actual dysuria.  Feels like maybe she is having a little more urgency as well.  She cannot remember the last time she had a UTI so she's not really sure "what that's like".  Reports she is having some lower back pain as well.    Has an IUD and does not bleeding very often.  Thinks her urine looks a little pink today.  Does not think she is bleeding today.  Denies fever or flank pain.  Pt reports, although she has minimal bleeding or spotting, that she is has questioned if her IUD is in proper placement due to some intermittent cramping she's had since placement.  Has thought about calling several times but hasn't as it was never bad enough.  Is taking OTC cranberry tablets and trying to drink a lot more water as well.  Reports she's "not a medicine person" so would like to do anything with supplements or herbal remedies that is possible.  GYNECOLOGIC HISTORY: No LMP recorded. Patient is not currently having periods (Reason: IUD). Contraception: IUD  Patient Active Problem List   Diagnosis Date Noted  . Genetic testing 12/24/2015  . Family history of colon cancer 11/29/2015  . Family history of breast cancer in female 11/29/2015    Past Medical History:  Diagnosis Date  . Abnormal Pap smear of cervix 2005 & 2008   colpo biopsy  . Testing for genetic disease carrier status 12/2015   negative for BRCA, no gene for colon -needs colonoscopy eearlier than age 76    Past Surgical History:  Procedure Laterality Date  . COLPOSCOPY  2005  . INTRAUTERINE DEVICE (IUD) INSERTION  09/2015    MEDS:   Current Outpatient Prescriptions on File Prior to Visit   Medication Sig Dispense Refill  . levonorgestrel (MIRENA) 20 MCG/24HR IUD 1 each by Intrauterine route once.     No current facility-administered medications on file prior to visit.      ALLERGIES: Sulfamethoxazole-trimethoprim  Family History  Problem Relation Age of Onset  . Colon polyps Mother        approx 2 polyps  . Colon cancer Father 89       colon with mets to lungs; colon surgery w/ recurrence; +smoker and heavy alcohol use  . Stroke Maternal Grandmother 40  . Breast cancer Maternal Grandmother 40       lumpectomy and radiation  . Diabetes Maternal Grandfather   . Diabetes Paternal Grandmother   . Kidney Stones Paternal Grandmother   . Alzheimer's disease Paternal Grandmother   . Heart disease Paternal Grandfather   . Heart attack Paternal Grandfather        multiple  . Brain cancer Paternal Uncle 64  . Lung cancer Paternal Uncle 94  . Prostate cancer Paternal Uncle 68       prostate  . Colon cancer Paternal Uncle        dx. mid-50s; something else removed from colon at age 102y  . Colon cancer Paternal Uncle 36       colon; "Lynch syndrome"  . Cancer Other        maternal  great grandfather (MGM's father); dx. older age; NOS cancer    SH:  Married, non smoker  Review of Systems  Constitutional: Negative for chills, fever and malaise/fatigue.  Respiratory: Negative.   Cardiovascular: Negative.   Gastrointestinal: Negative for abdominal pain, constipation, diarrhea, nausea and vomiting.  Genitourinary: Positive for hematuria and urgency. Negative for dysuria and flank pain.  Musculoskeletal: Negative.     PHYSICAL EXAMINATION:    BP 110/60 (BP Location: Right Arm, Patient Position: Sitting, Cuff Size: Normal)   Pulse 78   Resp 14   Ht 5' 7"  (1.702 m)   Wt 155 lb (70.3 kg)   BMI 24.28 kg/m     General appearance: alert, cooperative and appears stated age CV:  Regular rate and rhythm Lungs:  clear to auscultation, no wheezes, rales or rhonchi,  symmetric air entry Abdomen: soft, mild suprapubic tenderness; bowel sounds normal; no masses,  no organomegaly Flank:  No CVA tenderness  Pelvic: External genitalia:  no lesions              Urethra:  normal appearing urethra with no masses, tenderness or lesions              Bartholins and Skenes: normal                 Vagina: normal appearing vagina with normal color and discharge, no lesions              Cervix: no lesions, IUD string not seen today, +bleeding from os noted              Bimanual Exam:  Uterus:  normal size, contour, position, consistency, mobility, non-tender              Adnexa: no masses or fullness but +right sided tenderness noted              Anus: no lesions  Chaperone was present for exam.  Due to non-visualized string and pain, feel PUS appropriate.  Was able to do in-office today.  Uterus: 8.5 x 5.2 x 4.8cm with IUD in correct location.  IUD string noted 1cm from external os Endometrium:  5.65m Left ovary:  2.4 x 1.2 x 1.7cm Right ovary:  3.1 x 1.9 x 2.2cm with 1.6 x 1.3cm collapsed corpus luteal cyst Cul de sac:  Moderate fluid noted in cul-de-sac and around right adnexa  Assessment: Pelvic pain Pt reported hematuria that I think is actually vaginally bleeding Non-visualized IUD string but confirmation of correctly located IUD noted on ultrasound Possible UTI Right corpus luteal cyst with free fluid around adnexa and in cul de sac  Plan: Pt declines pain medication for treatment.  OTC options discussed. Urine culture pending.  If positive, will contact patient over the weekend and treat D/w pt self-limiting nature of this type of ovarian cyst and free fluid noted today.  No specific follow up needed if symptoms fully resolve over the next 1-2 weeks which is what I would expect.  All questions answered.  ~30 minutes spent with patient >50% of time was in face to face discussion of above.

## 2016-11-23 LAB — URINE CULTURE

## 2016-11-24 MED ORDER — FLUCONAZOLE 150 MG PO TABS: 150.0000 mg | ORAL_TABLET | Freq: Once | ORAL | 0 refills | Status: AC

## 2016-11-24 MED ORDER — CEPHALEXIN 500 MG PO CAPS: 500.0000 mg | ORAL_CAPSULE | Freq: Four times a day (QID) | ORAL | 0 refills | Status: DC

## 2016-11-24 NOTE — Addendum Note (Signed)
Addended by: Jerene BearsMILLER, Jinan Biggins S on: 11/24/2016 03:26 PM   Modules accepted: Orders

## 2016-11-25 ENCOUNTER — Telehealth: Payer: Self-pay | Admitting: *Deleted

## 2016-11-25 NOTE — Telephone Encounter (Signed)
Call to patient. Message given to patient as seen below from Dr. Hyacinth MeekerMiller. Patient states she is feeling fine other than having some cramping from taking the antibiotic. States she thinks the cramping is associated with the fact that she seldom takes medication. Nurse visit scheduled for repeat urine culture on Wednesday 12/04/16 at 1000. Patient agreeable to date and time of appointment. Future order present for urine culture.   Routing to provider for final review. Patient agreeable to disposition. Will close encounter.

## 2016-11-25 NOTE — Telephone Encounter (Signed)
-----   Message from Jerene BearsMary S Miller, MD sent at 11/24/2016  3:25 PM EDT ----- Notified pt personally of results via phone call.  Keflex 500mg  qid x 5 days sent the pharmacy on file.  Recommended repeat urine culture in about 10 days.  Could you call and check on her as well as schedule the urine culture.  Order placed.  Thanks.

## 2016-12-04 ENCOUNTER — Ambulatory Visit (INDEPENDENT_AMBULATORY_CARE_PROVIDER_SITE_OTHER): Payer: Managed Care, Other (non HMO) | Admitting: *Deleted

## 2016-12-04 DIAGNOSIS — N3090 Cystitis, unspecified without hematuria: Secondary | ICD-10-CM | POA: Diagnosis not present

## 2016-12-04 NOTE — Progress Notes (Signed)
Patient returned to office for repeat urine culture. Patient has completed antibiotics. Patient states she is no longer having cramping, but continues to have back pain. Back pain has lessened.   Urine collected for culture and this is sent to the lab. Advised patient we will notify her via phone or MyChart when we have results.  Patient agreeable to disposition. Routing to provider for final review. Closing encounter.

## 2016-12-06 ENCOUNTER — Telehealth: Payer: Self-pay

## 2016-12-06 LAB — URINE CULTURE

## 2016-12-06 MED ORDER — NITROFURANTOIN MONOHYD MACRO 100 MG PO CAPS: 100.0000 mg | ORAL_CAPSULE | Freq: Two times a day (BID) | ORAL | 0 refills | Status: DC

## 2016-12-06 NOTE — Telephone Encounter (Signed)
Left message to call Adaline Trejos at [phone]. 

## 2016-12-06 NOTE — Telephone Encounter (Signed)
Spoke with patient. Results given as seen below. Patient verbalizes understanding. Macrobid 100 mg BID x 7 days #14 0RF sent to pharmacy on file. Urine TOC scheduled for 12/17/2016 at 10:30 am. Patient is agreeable to date and time.  Routing to provider for final review. Patient agreeable to disposition. Will close encounter.

## 2016-12-06 NOTE — Telephone Encounter (Signed)
-----   Message from Jerene Bears, MD sent at 12/06/2016  1:51 PM EDT ----- Please let pt know her urine culture is still positive for E coli.  There is a lower amount of the bacteria but it is not fully treated.  I would like ot change antibiotics to macrobid 100mg  bid x 7 days.  Repeat culture 2 weeks.  Thanks.

## 2016-12-17 ENCOUNTER — Ambulatory Visit (INDEPENDENT_AMBULATORY_CARE_PROVIDER_SITE_OTHER): Payer: Managed Care, Other (non HMO)

## 2016-12-17 VITALS — BP 116/70 | HR 72 | Resp 16 | Ht 67.0 in | Wt 154.0 lb

## 2016-12-17 DIAGNOSIS — R8290 Unspecified abnormal findings in urine: Secondary | ICD-10-CM | POA: Diagnosis not present

## 2016-12-17 NOTE — Progress Notes (Signed)
Patient here in office for a TOC urine culture. Patient states that she is feeling much better. Per patient, finished Macrobid on Friday 12/13/16.  Urine given and sent to the lab to be resulted.  Routing to provider for final review. Patient agreeable to disposition. Will close encounter.

## 2016-12-18 LAB — URINE CULTURE: ORGANISM ID, BACTERIA: NO GROWTH

## 2016-12-23 ENCOUNTER — Telehealth: Payer: Self-pay | Admitting: *Deleted

## 2016-12-23 NOTE — Telephone Encounter (Signed)
Detailed message left per DPR. Advised patient to return call to Irving Burtonmily to give an update about symptoms.

## 2016-12-23 NOTE — Telephone Encounter (Signed)
-----   Message from Jerene BearsMary S Miller, MD sent at 12/22/2016 11:00 PM EDT ----- Please let pt know her urine culture is finally negative.  Did her symptoms fully resolve?  Please get an update.  Thanks.

## 2016-12-30 NOTE — Telephone Encounter (Signed)
Call to patient. Patient states she has just been busy and forgot to return call. Patient states, "I am feeling a million times better." Patient appreciative of phone call.   Routing to provider for final review. Patient agreeable to disposition. Will close encounter.

## 2017-09-05 IMAGING — MG DIGITAL SCREENING BILATERAL MAMMOGRAM WITH CAD
4 series · 4 of 4 positions shown · non-contrast
Comparison: None.

CLINICAL DATA: Screening.

EXAM:
DIGITAL SCREENING BILATERAL MAMMOGRAM WITH CAD

[R MLO]
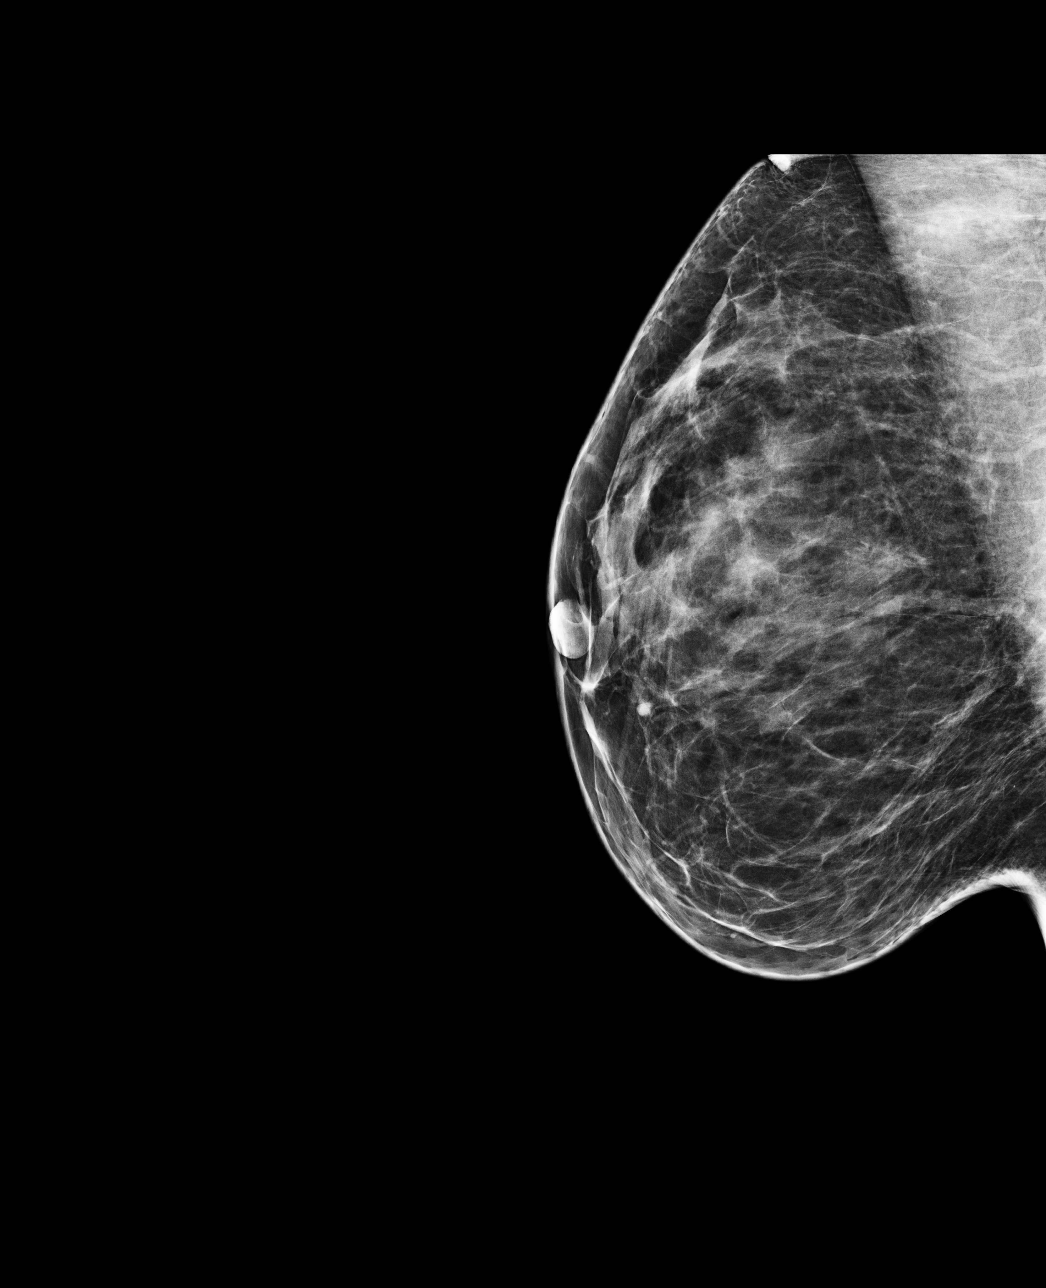

[L CC]
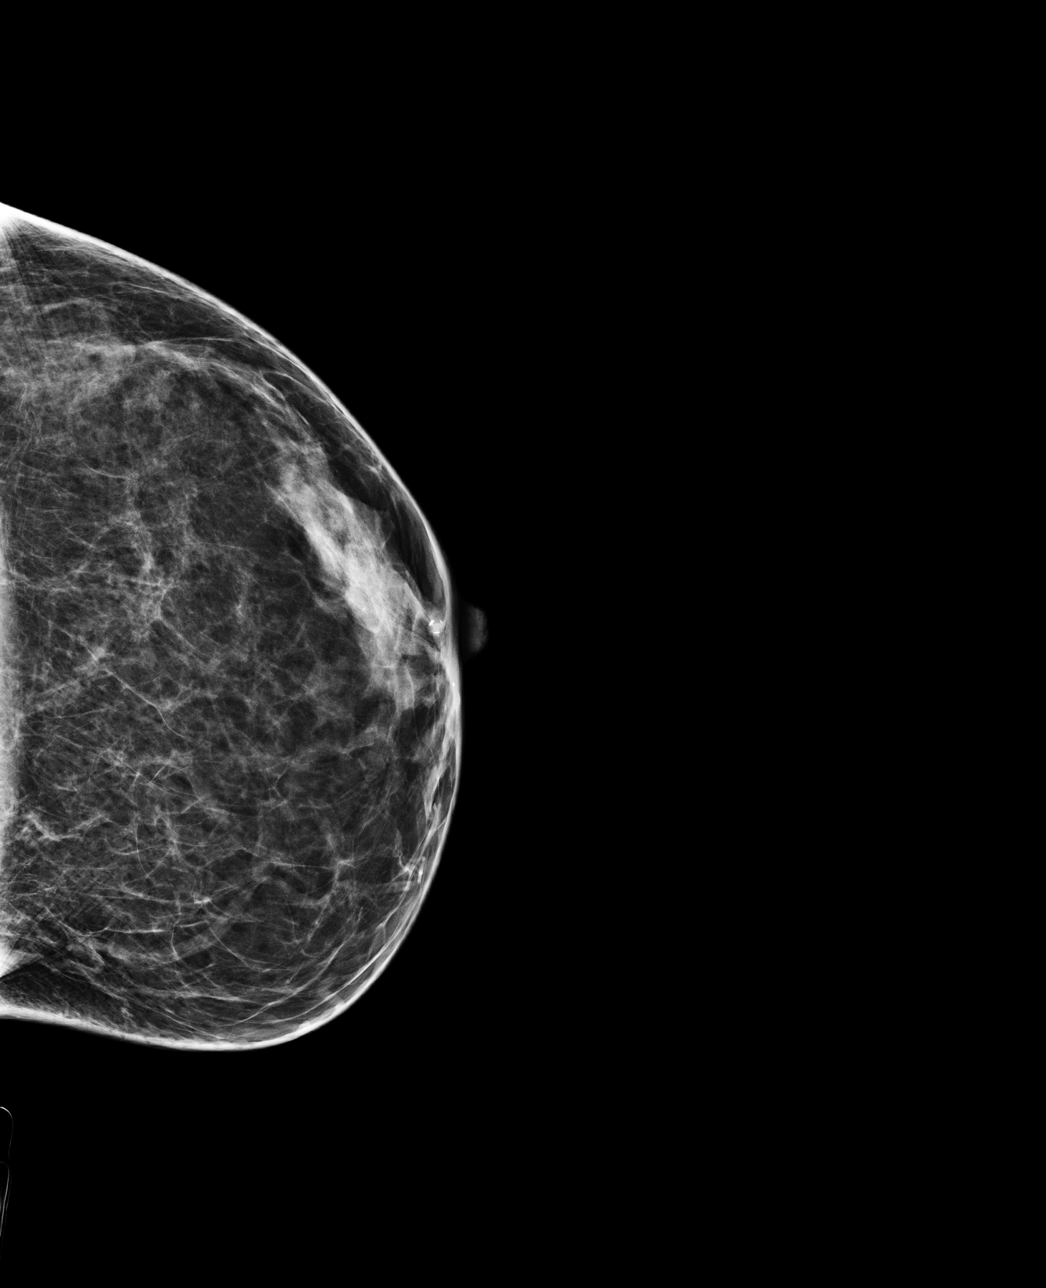

[R CC]
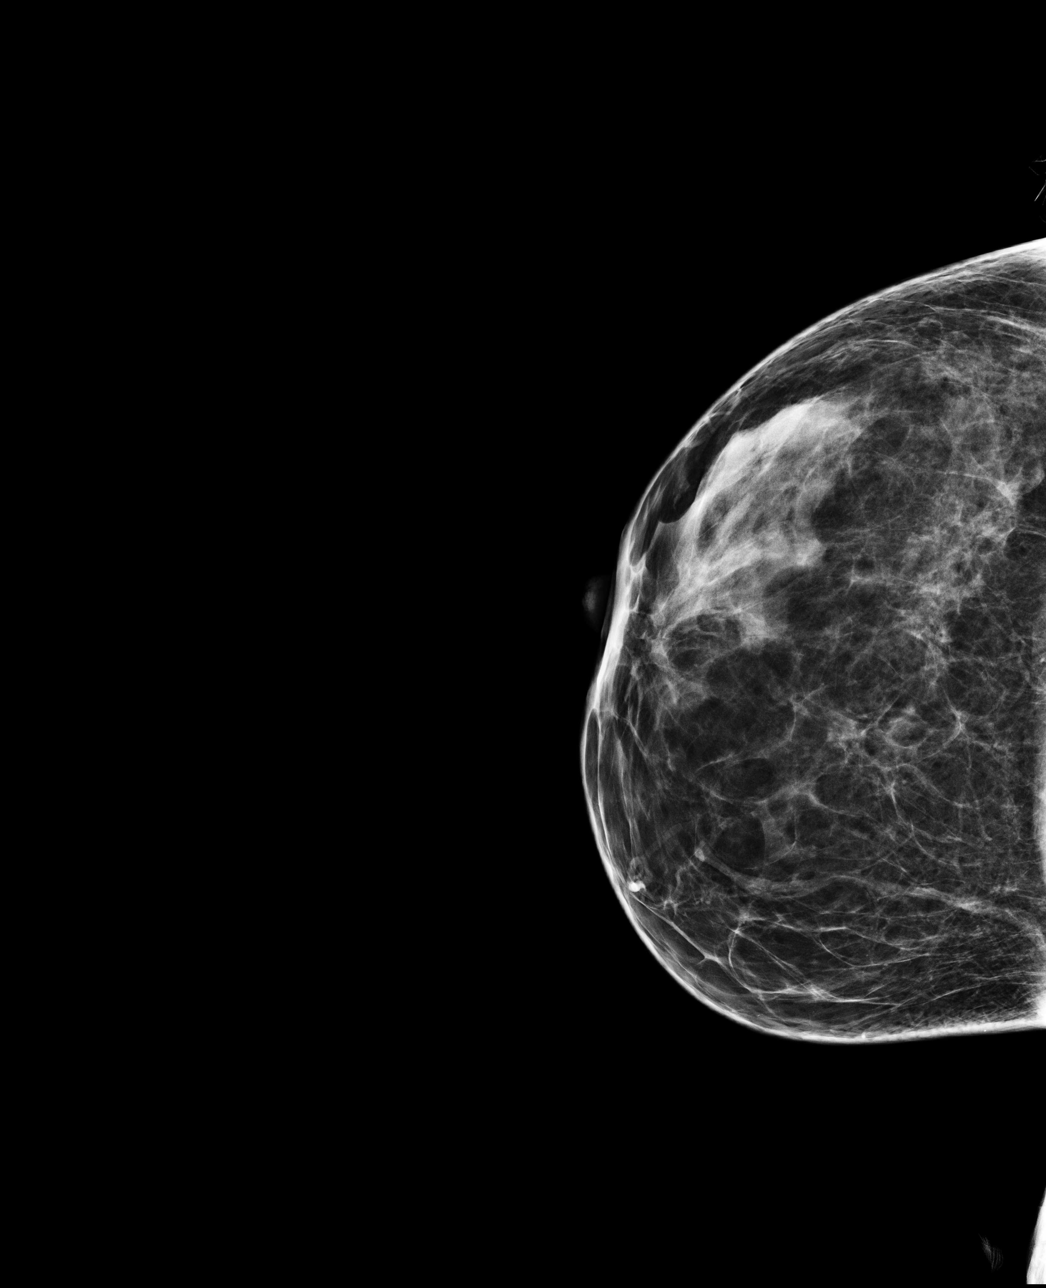

[L MLO]
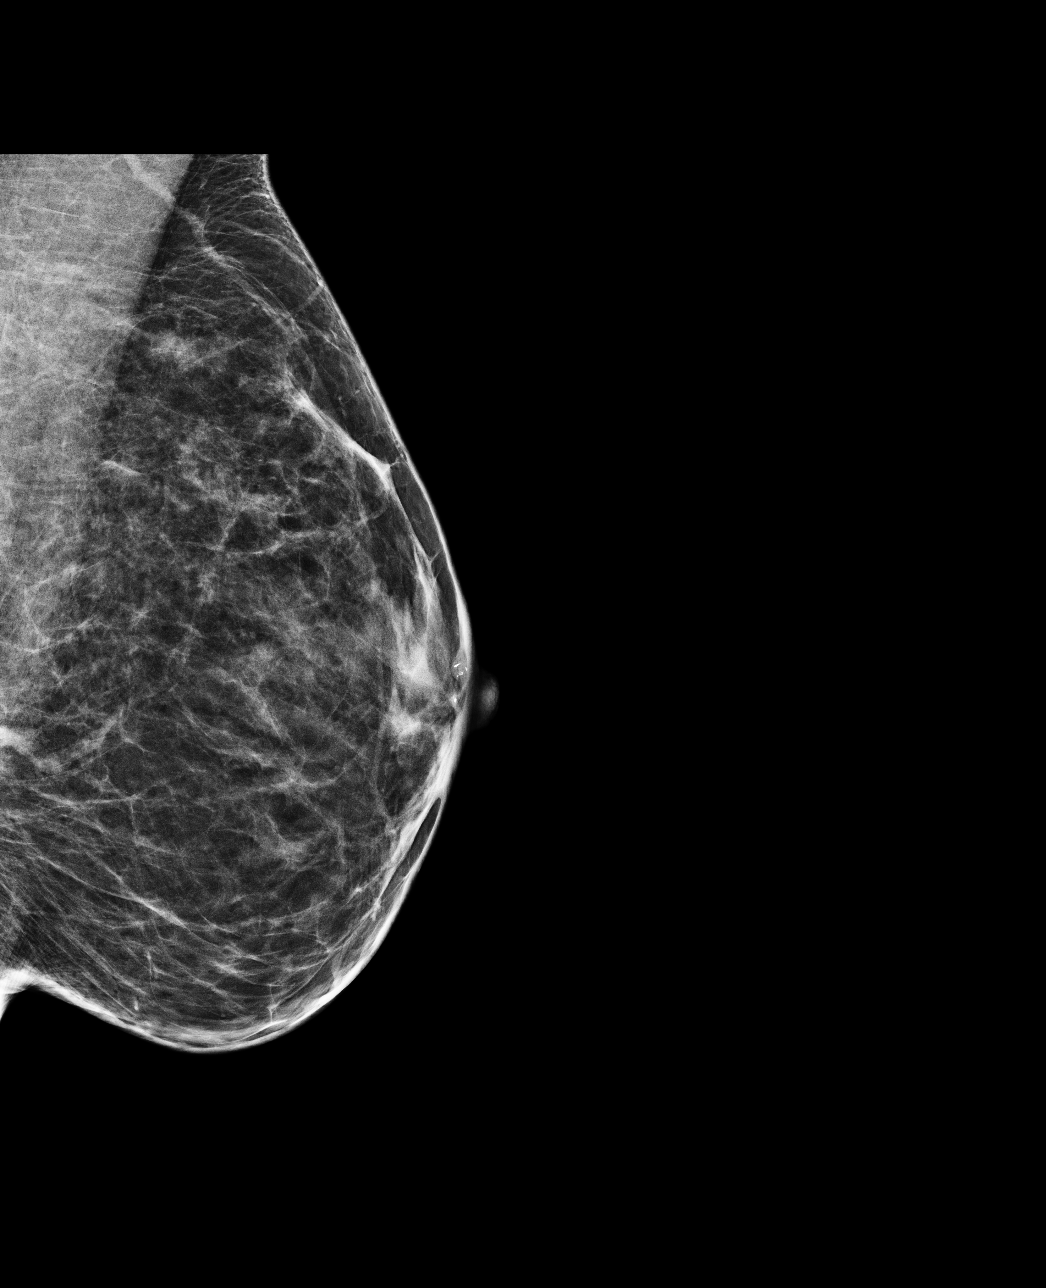

[4 of 4 positions shown; findings below may reference images not displayed]

ACR Breast Density Category b: There are scattered areas of
fibroglandular density.
FINDINGS: In the right breast, a possible asymmetry warrants further
evaluation. In the left breast, no findings suspicious for
malignancy. Images were processed with CAD.
IMPRESSION: Further evaluation is suggested for possible asymmetry in the right
breast.

RECOMMENDATION:
Diagnostic mammogram and possibly ultrasound of the right breast.
(Code:IS-8-KKB)

The patient will be contacted regarding the findings, and additional
imaging will be scheduled.

BI-RADS CATEGORY  0: Incomplete. Need additional imaging evaluation
and/or prior mammograms for comparison.

## 2019-05-04 ENCOUNTER — Other Ambulatory Visit: Payer: Self-pay

## 2019-05-07 ENCOUNTER — Ambulatory Visit: Payer: Managed Care, Other (non HMO) | Admitting: Obstetrics & Gynecology

## 2019-05-07 ENCOUNTER — Encounter: Payer: Self-pay | Admitting: Obstetrics & Gynecology

## 2019-05-07 ENCOUNTER — Other Ambulatory Visit: Payer: Self-pay

## 2019-05-07 ENCOUNTER — Other Ambulatory Visit (HOSPITAL_COMMUNITY)
Admission: RE | Admit: 2019-05-07 | Discharge: 2019-05-07 | Disposition: A | Discharge status: Home / Self Care | Payer: Managed Care, Other (non HMO) | Source: Ambulatory Visit | Attending: Obstetrics & Gynecology | Admitting: Obstetrics & Gynecology

## 2019-05-07 ENCOUNTER — Other Ambulatory Visit: Payer: Self-pay | Admitting: Obstetrics & Gynecology

## 2019-05-07 VITALS — BP 110/60 | HR 68 | Temp 97.0°F | Resp 10 | Ht 66.5 in | Wt 157.0 lb

## 2019-05-07 DIAGNOSIS — Z124 Encounter for screening for malignant neoplasm of cervix: Secondary | ICD-10-CM | POA: Diagnosis not present

## 2019-05-07 DIAGNOSIS — N6489 Other specified disorders of breast: Secondary | ICD-10-CM

## 2019-05-07 DIAGNOSIS — Z01419 Encounter for gynecological examination (general) (routine) without abnormal findings: Secondary | ICD-10-CM | POA: Diagnosis not present

## 2019-05-07 NOTE — Progress Notes (Signed)
39 y.o. G42P2002 Married White or Caucasian female here for annual exam.  Doing well.  Does not have VB.  IUD was placed 6/17.    No LMP recorded. (Menstrual status: IUD).          Sexually active: Yes.    The current method of family planning is IUD.    Exercising: Yes.    tennis, workout daily Smoker:  no  Health Maintenance: Pap:  09/08/15 Neg:Neg HR HPV  History of abnormal Pap:  Yes, years ago MMG: 2017 Right Breast MM/US  BIRADS 3:Probably benign/density b Colonoscopy:  2017 with Dr. Collene Mares - normal.  Follow up 5 years. TDaP:  08/2015 Screening Labs: PCP   reports that she quit smoking about 16 years ago. She has a 3.00 pack-year smoking history. She has never used smokeless tobacco. She reports current alcohol use of about 1.0 standard drinks of alcohol per week. She reports that she does not use drugs.  Past Medical History:  Diagnosis Date  . Abnormal Pap smear of cervix 2005 & 2008   colpo biopsy  . Testing for genetic disease carrier status 12/2015   negative for BRCA, no gene for colon -needs colonoscopy eearlier than age 32    Past Surgical History:  Procedure Laterality Date  . COLPOSCOPY  2005  . INTRAUTERINE DEVICE (IUD) INSERTION  09/2015    Current Outpatient Medications  Medication Sig Dispense Refill  . levonorgestrel (MIRENA) 20 MCG/24HR IUD 1 each by Intrauterine route once.    . mupirocin ointment (BACTROBAN) 2 % Apply to affected area 3 times daily     No current facility-administered medications for this visit.    Family History  Problem Relation Age of Onset  . Colon polyps Mother        approx 2 polyps  . Colon cancer Father 60       colon with mets to lungs; colon surgery w/ recurrence; +smoker and heavy alcohol use  . Stroke Maternal Grandmother 40  . Breast cancer Maternal Grandmother 40       lumpectomy and radiation  . Diabetes Maternal Grandfather   . Diabetes Paternal Grandmother   . Kidney Stones Paternal Grandmother   . Alzheimer's  disease Paternal Grandmother   . Heart disease Paternal Grandfather   . Heart attack Paternal Grandfather        multiple  . Brain cancer Paternal Uncle 82  . Lung cancer Paternal Uncle 16  . Prostate cancer Paternal Uncle 15       prostate  . Colon cancer Paternal Uncle        dx. mid-50s; something else removed from colon at age 78y  . Colon cancer Paternal Uncle 62       colon; "Lynch syndrome"  . Cancer Other        maternal great grandfather (MGM's father); dx. older age; NOS cancer    Review of Systems  All other systems reviewed and are negative.   Exam:   BP 110/60 (BP Location: Left Arm, Patient Position: Sitting, Cuff Size: Normal)   Pulse 68   Temp (!) 97 F (36.1 C) (Temporal)   Resp 10   Ht 5' 6.5" (1.689 m)   Wt 157 lb (71.2 kg)   BMI 24.96 kg/m   Height: 5' 6.5" (168.9 cm)  Ht Readings from Last 3 Encounters:  05/07/19 5' 6.5" (1.689 m)  12/17/16 5' 7"  (1.702 m)  11/21/16 5' 7"  (1.702 m)   General appearance: alert, cooperative and appears  stated age Head: Normocephalic, without obvious abnormality, atraumatic Neck: no adenopathy, supple, symmetrical, trachea midline and thyroid normal to inspection and palpation Lungs: clear to auscultation bilaterally Breasts: normal appearance, no masses or tenderness Heart: regular rate and rhythm Abdomen: soft, non-tender; bowel sounds normal; no masses,  no organomegaly Extremities: extremities normal, atraumatic, no cyanosis or edema Skin: Skin color, texture, turgor normal. No rashes or lesions Lymph nodes: Cervical, supraclavicular, and axillary nodes normal. No abnormal inguinal nodes palpated Neurologic: Grossly normal   Pelvic: External genitalia:  no lesions              Urethra:  normal appearing urethra with no masses, tenderness or lesions              Bartholins and Skenes: normal                 Vagina: normal appearing vagina with normal color and discharge, no lesions              Cervix: no  lesions              Pap taken: Yes.   Bimanual Exam:  Uterus:  normal size, contour, position, consistency, mobility, non-tender              Adnexa: normal adnexa and no mass, fullness, tenderness               Rectovaginal: Confirms               Anus:  normal sphincter tone, no lesions  Chaperone, Terence Lux, CMA, was present for exam.  A:  Well Woman with normal exam Non-visualized IUD string (u/s 8/18 showed string 1cm from os) Strong family hx of colon cancer, father diagnosed at age 75.  Negative genetic testing in pt Mirena IUD placed 2017.  Pt aware due for removal 2022  P:   Mammogram follow up never done in 2017.  Will schedule for pt. pap smear with HR HPV obtained today due to hx of abnormal pap smear without records Colonoscopy due at least every 5 years and due again in 2022 Had lab work scheduled with PCP next week Return annually or prn

## 2019-05-07 NOTE — Progress Notes (Signed)
Patient scheduled while in office for bilateral Dx MMG and right breast US, if needed, at [facility]. Scheduled for 05/27/19 at 1:40pm, arrive at 1:20pm. Patient is agreeable to date and time.   Placed in MMG hold.

## 2019-05-11 LAB — CYTOLOGY - PAP
Comment: NEGATIVE
Diagnosis: NEGATIVE
High risk HPV: NEGATIVE

## 2019-05-27 ENCOUNTER — Other Ambulatory Visit: Payer: Self-pay

## 2019-05-27 ENCOUNTER — Ambulatory Visit
Admission: RE | Admit: 2019-05-27 | Discharge: 2019-05-27 | Disposition: A | Discharge status: Home / Self Care | Payer: Managed Care, Other (non HMO) | Source: Ambulatory Visit | Attending: Obstetrics & Gynecology | Admitting: Obstetrics & Gynecology

## 2019-05-27 DIAGNOSIS — N6489 Other specified disorders of breast: Secondary | ICD-10-CM

## 2020-04-15 DIAGNOSIS — U071 COVID-19: Secondary | ICD-10-CM

## 2020-04-15 HISTORY — DX: COVID-19: U07.1

## 2020-08-07 NOTE — Progress Notes (Signed)
40 y.o. G46P2002 Married Caucasian female here for annual exam.    No menses with Mirena IUD.   Healthy diet and exercise routine.   Donates blood and has routine blood work done there.   Takes collagen for joint pain.   Just sold their home and will be clearing a lot.  Works at [employer], Engineer, petroleum.   Received her Covid vaccine and not booster.  Had Covid April 15, 2020.  PCP:  [facility]  No LMP recorded. (Menstrual status: IUD).     Period Cycle (Days):  (no cycles with Mirena IUD)     Sexually active: Yes.    The current method of family planning is IUD--Mirena 09/2015.    Exercising: Yes.    running, yoga, tennis Smoker:  no  Health Maintenance: Pap: 05-07-19 Neg:Neg HR HPV, 09/08/15 Neg:Neg HR HPV   History of abnormal Pap: Yes, years ago, Hx colpo 2005, 2008 --no treatment MMG: 05-27-19 Diag.Bil.w/Rt.US/Neg Lt., Rt.Br. dense fibroglandular tissue-Neg/Birads1/screening age 84 Colonoscopy:  2017 normal;next after 39yr old BMD:   n/a  Result  n/a TDaP:  08/2015 Gardasil:   no HIV:neg in preg Hep C: Unsure Screening Labs:  PCP.   reports that she quit smoking about 17 years ago. She has a 3.00 pack-year smoking history. She has never used smokeless tobacco. She reports current alcohol use of about 2.0 standard drinks of alcohol per week. She reports that she does not use drugs.  Past Medical History:  Diagnosis Date  . Abnormal Pap smear of cervix 2005 & 2008   colpo biopsy  . COVID 04/15/2020  . Testing for genetic disease carrier status 12/2015   negative for BRCA, no gene for colon -needs colonoscopy eearlier than age 40   Past Surgical History:  Procedure Laterality Date  . COLPOSCOPY  2005  . INTRAUTERINE DEVICE (IUD) INSERTION  09/2015    Current Outpatient Medications  Medication Sig Dispense Refill  . levonorgestrel (MIRENA) 20 MCG/24HR IUD 1 each by Intrauterine route once.     No current facility-administered medications for this visit.     Family History  Problem Relation Age of Onset  . Colon polyps Mother        approx 2 polyps  . Colon cancer Father 349      colon with mets to lungs; colon surgery w/ recurrence; +smoker and heavy alcohol use  . Stroke Maternal Grandmother 40  . Breast cancer Maternal Grandmother 40       lumpectomy and radiation  . Diabetes Maternal Grandfather   . Diabetes Paternal Grandmother   . Kidney Stones Paternal Grandmother   . Alzheimer's disease Paternal Grandmother   . Heart disease Paternal Grandfather   . Heart attack Paternal Grandfather        multiple  . Brain cancer Paternal Uncle 512 . Lung cancer Paternal Uncle 579 . Prostate cancer Paternal Uncle 5109      prostate  . Colon cancer Paternal Uncle        dx. mid-50s; something else removed from colon at age 6956y . Colon cancer Paternal Uncle 581      colon; "Lynch syndrome"  . Cancer Other        maternal great grandfather (MGM's father); dx. older age; NOS cancer    Review of Systems  All other systems reviewed and are negative.   Exam:   BP 118/72   Pulse 66   Ht 5' 7"  (1.702 m)  Wt 150 lb (68 kg)   SpO2 99%   BMI 23.49 kg/m     General appearance: alert, cooperative and appears stated age Head: normocephalic, without obvious abnormality, atraumatic Neck: no adenopathy, supple, symmetrical, trachea midline and thyroid normal to inspection and palpation Lungs: clear to auscultation bilaterally Breasts: normal appearance, no masses or tenderness, No nipple retraction or dimpling, No nipple discharge or bleeding, No axillary adenopathy Heart: regular rate and rhythm Abdomen: soft, non-tender; no masses, no organomegaly Extremities: extremities normal, atraumatic, no cyanosis or edema Skin: skin color, texture, turgor normal. No rashes or lesions Lymph nodes: cervical, supraclavicular, and axillary nodes normal. Neurologic: grossly normal  Pelvic: External genitalia:  no lesions              No abnormal  inguinal nodes palpated.              Urethra:  normal appearing urethra with no masses, tenderness or lesions              Bartholins and Skenes: normal                 Vagina: normal appearing vagina with normal color and discharge, no lesions              Cervix: no lesions.  No IUD strings seen.               Pap taken: No. Bimanual Exam:  Uterus:  normal size, contour, position, consistency, mobility, non-tender              Adnexa: no mass, fullness, tenderness            Chaperone was present for exam.  Assessment:   Well woman visit with normal exam. Mirena IUD.  Nonvisualization of strings.  Prior US confirming position 8/18. Hx abnormal paps.  FH colon cancer.   Plan: Mammogram screening discussed. Self breast awareness reviewed. Pap and HR HPV note needed.  Mirena IUD removal in 2024.  Guidelines for Calcium, Vitamin D, regular exercise program including cardiovascular and weight bearing exercise. Start Gardasil vaccine series.  Colonoscopy age 70.  Follow up annually and prn.

## 2020-08-08 ENCOUNTER — Ambulatory Visit (INDEPENDENT_AMBULATORY_CARE_PROVIDER_SITE_OTHER): Payer: Managed Care, Other (non HMO) | Admitting: Obstetrics and Gynecology

## 2020-08-08 ENCOUNTER — Encounter: Payer: Self-pay | Admitting: Obstetrics and Gynecology

## 2020-08-08 ENCOUNTER — Other Ambulatory Visit: Payer: Self-pay

## 2020-08-08 VITALS — BP 118/72 | HR 66 | Ht 67.0 in | Wt 150.0 lb

## 2020-08-08 DIAGNOSIS — Z23 Encounter for immunization: Secondary | ICD-10-CM | POA: Diagnosis not present

## 2020-08-08 DIAGNOSIS — Z01419 Encounter for gynecological examination (general) (routine) without abnormal findings: Secondary | ICD-10-CM | POA: Diagnosis not present

## 2020-08-08 NOTE — Patient Instructions (Signed)

## 2020-10-10 ENCOUNTER — Other Ambulatory Visit: Payer: Self-pay

## 2020-10-10 ENCOUNTER — Ambulatory Visit (INDEPENDENT_AMBULATORY_CARE_PROVIDER_SITE_OTHER): Payer: Managed Care, Other (non HMO) | Admitting: Anesthesiology

## 2020-10-10 DIAGNOSIS — Z23 Encounter for immunization: Secondary | ICD-10-CM | POA: Diagnosis not present

## 2021-02-09 ENCOUNTER — Ambulatory Visit (INDEPENDENT_AMBULATORY_CARE_PROVIDER_SITE_OTHER): Payer: Managed Care, Other (non HMO) | Admitting: Gynecology

## 2021-02-09 ENCOUNTER — Other Ambulatory Visit: Payer: Self-pay

## 2021-02-09 DIAGNOSIS — Z23 Encounter for immunization: Secondary | ICD-10-CM | POA: Diagnosis not present

## 2023-12-29 ENCOUNTER — Encounter: Payer: Self-pay | Admitting: Women's Health

## 2023-12-29 ENCOUNTER — Ambulatory Visit (INDEPENDENT_AMBULATORY_CARE_PROVIDER_SITE_OTHER): Admitting: Women's Health

## 2023-12-29 ENCOUNTER — Other Ambulatory Visit (HOSPITAL_COMMUNITY)
Admission: RE | Admit: 2023-12-29 | Discharge: 2023-12-29 | Disposition: A | Discharge status: Home / Self Care | Source: Ambulatory Visit | Attending: Women's Health | Admitting: Women's Health

## 2023-12-29 VITALS — BP 108/74 | HR 79 | Ht 66.0 in | Wt 144.0 lb

## 2023-12-29 DIAGNOSIS — Z30430 Encounter for insertion of intrauterine contraceptive device: Secondary | ICD-10-CM | POA: Diagnosis not present

## 2023-12-29 DIAGNOSIS — Z124 Encounter for screening for malignant neoplasm of cervix: Secondary | ICD-10-CM | POA: Diagnosis present

## 2023-12-29 DIAGNOSIS — Z01419 Encounter for gynecological examination (general) (routine) without abnormal findings: Secondary | ICD-10-CM | POA: Diagnosis not present

## 2023-12-29 DIAGNOSIS — Z800 Family history of malignant neoplasm of digestive organs: Secondary | ICD-10-CM

## 2023-12-29 DIAGNOSIS — Z1151 Encounter for screening for human papillomavirus (HPV): Secondary | ICD-10-CM

## 2023-12-29 DIAGNOSIS — Z30432 Encounter for removal of intrauterine contraceptive device: Secondary | ICD-10-CM

## 2023-12-29 LAB — POCT URINE PREGNANCY: Preg Test, Ur: NEGATIVE

## 2023-12-29 MED ORDER — LEVONORGESTREL 20 MCG/DAY IU IUD
1.0000 | INTRAUTERINE_SYSTEM | Freq: Once | INTRAUTERINE | Status: AC
  Administered 2023-12-29: 1 via INTRAUTERINE

## 2023-12-29 NOTE — Progress Notes (Signed)
 WELL-WOMAN EXAMINATION Patient name: Holly Vasquez MRN [id number]  Date of birth: December 31, 1980 Chief Complaint:   Annual Exam and Contraception  History of Present Illness:   Holly Vasquez is a 43 y.o. G27P2002 Caucasian female being seen today for a routine well-woman exam. Time for IUD removal/reinsert. Mirena  was placed July 2017, last 3 months has felt 'a little crazy'. Has very light periods, enough to track. No sex since recently, neg HPT last month.   PCP: Izetta Reid     Patient's last menstrual period was 12/01/2023 (approximate). The current method of family planning is IUD.  Last pap 05/07/19. Results were: NILM w/ HRHPV negative.  Last mammogram: 2025/[facility]. Results were: normal. Family h/o breast cancer: yes MGM Last colonoscopy: 2024. Results were: normal. Family h/o colorectal cancer: yes father dx @ 33yo     12/29/2023    1:36 PM  Depression screen PHQ 2/9  Decreased Interest 0  Down, Depressed, Hopeless 1  PHQ - 2 Score 1  Altered sleeping 1  Tired, decreased energy 1  Change in appetite 1  Feeling bad or failure about yourself  1  Trouble concentrating 2  Moving slowly or fidgety/restless 1  Suicidal thoughts 0  PHQ-9 Score 8        12/29/2023    1:37 PM  GAD 7 : Generalized Anxiety Score  Nervous, Anxious, on Edge 1  Control/stop worrying 1  Worry too much - different things 1  Trouble relaxing 1  Restless 1  Easily annoyed or irritable 2  Afraid - awful might happen 1  Total GAD 7 Score 8     Review of Systems:   Pertinent items are noted in HPI Denies any headaches, blurred vision, fatigue, shortness of breath, chest pain, abdominal pain, abnormal vaginal discharge/itching/odor/irritation, problems with periods, bowel movements, urination, or intercourse unless otherwise stated above. Pertinent History Reviewed:  Reviewed past medical,surgical, social and family history.  Reviewed problem list, medications and allergies. Physical Assessment:    Vitals:   12/29/23 1324  BP: 108/74  Pulse: 79  Weight: 144 lb (65.3 kg)  Height: 5' 6 (1.676 m)  Body mass index is 23.24 kg/m.        Physical Examination:   General appearance - well appearing, and in no distress  Mental status - alert, oriented to person, place, and time  Psych:  She has a normal mood and affect  Skin - warm and dry, normal color, no suspicious lesions noted  Chest - effort normal, all lung fields clear to auscultation bilaterally  Heart - normal rate and regular rhythm  Neck:  midline trachea, no thyromegaly or nodules  Breasts - breasts appear normal, no suspicious masses, no skin or nipple changes or  axillary nodes  Abdomen - soft, nontender, nondistended, no masses or organomegaly  Pelvic - VULVA: normal appearing vulva with no masses, tenderness or lesions  VAGINA: normal appearing vagina with normal color and discharge, no lesions  CERVIX: normal appearing cervix without discharge or lesions, no CMT  Thin prep pap is done w/ HR HPV cotesting  UTERUS: uterus is felt to be normal size, shape, consistency and nontender   ADNEXA: No adnexal masses or tenderness noted.  Extremities:  No swelling or varicosities noted  Chaperone: Aleck Blase  Results for orders placed or performed in visit on 12/29/23 (from the past 24 hours)  POCT urine pregnancy   Collection Time: 12/29/23  2:17 PM  Result Value Ref Range   Preg Test, Ur Negative  Negative    Assessment & Plan:  1) Well-Woman Exam  2) IUD removal/reinsertion see note below  3) Family h/o colon cancer in dad> dx @33yo , pt has had neg hereditary cancer screening and normal TCS last year  Labs/procedures today: pap, IUD removal/reinsertion  Mammogram: 2026 (72yr from last) or sooner if problems Colonoscopy: per GI, or sooner if problems  Orders Placed This Encounter  Procedures   POCT urine pregnancy    Meds:  Meds ordered this encounter  Medications   levonorgestrel  (MIRENA ) 20  MCG/DAY IUD 1 each    Follow-up: Return in about 4 weeks (around 01/26/2024) for IUD f/u, CNM, in person.  Suzen JONELLE Fetters CNM, [facility] 12/29/2023 2:29 PM     IUD REMOVAL & RE-INSERTION Patient name: Holly Vasquez MRN [id number]  Date of birth: 09/27/1980 Subjective Findings:   Holly Vasquez is a 43 y.o. G76P2002 Caucasian female being seen today for removal of a Mirena   IUD and insertion of a Mirena   IUD. Her IUD was placed July 2017.  Patient's last menstrual period was 12/01/2023 (approximate). Last sexual intercourse was >72mths ago Last pap today.   The risks and benefits of the method and placement have been thouroughly reviewed with the patient and all questions were answered.  Specifically the patient is aware of failure rate of 04/998, expulsion of the IUD and of possible perforation.  The patient is aware of irregular bleeding due to the method and understands the incidence of irregular bleeding diminishes with time.  Signed copy of informed consent in chart.      12/29/2023    1:36 PM  Depression screen PHQ 2/9  Decreased Interest 0  Down, Depressed, Hopeless 1  PHQ - 2 Score 1  Altered sleeping 1  Tired, decreased energy 1  Change in appetite 1  Feeling bad or failure about yourself  1  Trouble concentrating 2  Moving slowly or fidgety/restless 1  Suicidal thoughts 0  PHQ-9 Score 8        12/29/2023    1:37 PM  GAD 7 : Generalized Anxiety Score  Nervous, Anxious, on Edge 1  Control/stop worrying 1  Worry too much - different things 1  Trouble relaxing 1  Restless 1  Easily annoyed or irritable 2  Afraid - awful might happen 1  Total GAD 7 Score 8     Pertinent History Reviewed:   Reviewed past medical,surgical, social, obstetrical and family history.  Reviewed problem list, medications and allergies. Objective Findings & Procedure:    Vitals:   12/29/23 1324  BP: 108/74  Pulse: 79  Weight: 144 lb (65.3 kg)  Height: 5' 6 (1.676 m)  Body mass  index is 23.24 kg/m.  Results for orders placed or performed in visit on 12/29/23 (from the past 24 hours)  POCT urine pregnancy   Collection Time: 12/29/23  2:17 PM  Result Value Ref Range   Preg Test, Ur Negative Negative     Time out was performed.  A graves speculum was placed in the vagina.  The cervix was visualized, prepped using Betadine. The strings were visible. They were grasped and the Mirena  IUD was easily removed. The cervix was then grasped with a single-tooth tenaculum. The uterus was found to be anteroflexed and it sounded to 8 cm.  Mirena   IUD placed per manufacturer's recommendations without complications. The strings were trimmed to approximately 3 cm.  The patient tolerated the procedure well.   Informal transvaginal sonogram was performed and the proper  placement of the IUD was verified.   Chaperone: Aleck Blase Assessment & Plan:   1) Mirena  IUD removal & Mirena  insertion The patient was given post procedure instructions, including signs and symptoms of infection and to check for the strings after each menses or each month, and refraining from intercourse or anything in the vagina for 3 days. She was given a care card with date IUD placed, and date IUD to be removed. She is scheduled for a f/u appointment in 4 weeks.  Orders Placed This Encounter  Procedures   POCT urine pregnancy    Follow-up: Return in about 4 weeks (around 01/26/2024) for IUD f/u, CNM, in person.  Suzen JONELLE Fetters CNM, [facility] 12/29/2023 2:29 PM

## 2023-12-29 NOTE — Patient Instructions (Signed)

## 2024-01-02 LAB — CYTOLOGY - PAP
Adequacy: ABSENT
Comment: NEGATIVE
Diagnosis: UNDETERMINED — AB
High risk HPV: NEGATIVE

## 2024-01-05 ENCOUNTER — Ambulatory Visit: Payer: Self-pay | Admitting: Women's Health

## 2024-01-05 DIAGNOSIS — R87619 Unspecified abnormal cytological findings in specimens from cervix uteri: Secondary | ICD-10-CM | POA: Insufficient documentation

## 2024-01-26 ENCOUNTER — Encounter: Payer: Self-pay | Admitting: Women's Health

## 2024-01-26 ENCOUNTER — Ambulatory Visit: Admitting: Women's Health

## 2024-01-26 VITALS — BP 117/75 | HR 63 | Ht 66.0 in | Wt 147.0 lb

## 2024-01-26 DIAGNOSIS — Z1331 Encounter for screening for depression: Secondary | ICD-10-CM | POA: Diagnosis not present

## 2024-01-26 DIAGNOSIS — Z30431 Encounter for routine checking of intrauterine contraceptive device: Secondary | ICD-10-CM | POA: Diagnosis not present

## 2024-01-26 NOTE — Progress Notes (Signed)
   GYN VISIT Patient name: Holly Vasquez MRN [id number]  Date of birth: 01-21-1981 Chief Complaint:   IUD check  History of Present Illness:   Holly Vasquez is a 43 y.o. G2P2002 Caucasian female being seen today for IUD check. Mirena  inserted 12/29/23. No problems.     Patient's last menstrual period was 12/01/2023 (approximate). The current method of family planning is IUD.  Last pap 12/29/23. Results were: ASCUS w/ HRHPV negative     12/29/2023    1:36 PM  Depression screen PHQ 2/9  Decreased Interest 0  Down, Depressed, Hopeless 1  PHQ - 2 Score 1  Altered sleeping 1  Tired, decreased energy 1  Change in appetite 1  Feeling bad or failure about yourself  1  Trouble concentrating 2  Moving slowly or fidgety/restless 1  Suicidal thoughts 0  PHQ-9 Score 8        12/29/2023    1:37 PM  GAD 7 : Generalized Anxiety Score  Nervous, Anxious, on Edge 1  Control/stop worrying 1  Worry too much - different things 1  Trouble relaxing 1  Restless 1  Easily annoyed or irritable 2  Afraid - awful might happen 1  Total GAD 7 Score 8     Review of Systems:   Pertinent items are noted in HPI Denies fever/chills, dizziness, headaches, visual disturbances, fatigue, shortness of breath, chest pain, abdominal pain, vomiting, abnormal vaginal discharge/itching/odor/irritation, problems with periods, bowel movements, urination, or intercourse unless otherwise stated above.  Pertinent History Reviewed:  Reviewed past medical,surgical, social, obstetrical and family history.  Reviewed problem list, medications and allergies. Physical Assessment:   Vitals:   01/26/24 1353  BP: 117/75  Pulse: 63  Weight: 147 lb (66.7 kg)  Height: 5' 6 (1.676 m)  Body mass index is 23.73 kg/m.       Physical Examination:   General appearance: alert, well appearing, and in no distress  Mental status: alert, oriented to person, place, and time  Skin: warm & dry   Cardiovascular: normal heart rate  noted  Respiratory: normal respiratory effort, no distress  Abdomen: soft, non-tender   Pelvic: VULVA: normal appearing vulva with no masses, tenderness or lesions, VAGINA: normal appearing vagina with normal color and discharge, no lesions, CERVIX: normal appearing cervix without discharge or lesions, IUD strings visible and appropriate length  Extremities: no edema   Chaperone: Latisha Cresenzo  No results found for this or any previous visit (from the past 24 hours).  Assessment & Plan:  1) IUD check> in place and doing well  Meds: No orders of the defined types were placed in this encounter.   No orders of the defined types were placed in this encounter.   Return in about 1 year (around 01/25/2025) for Physical.  Suzen JONELLE Fetters CNM, [facility] 01/26/2024 2:02 PM
